# Patient Record
Sex: Female | Born: 1985 | Race: Black or African American | Hispanic: No | Marital: Single | Smoking: Never smoker
Health system: Southern US, Community
[De-identification: ages and names within clinical notes are randomized; demographics above are authoritative.]

## PROBLEM LIST (undated history)

## (undated) DIAGNOSIS — E039 Hypothyroidism, unspecified: Secondary | ICD-10-CM

## (undated) DIAGNOSIS — E669 Obesity, unspecified: Secondary | ICD-10-CM

## (undated) HISTORY — PX: CHOLECYSTECTOMY: SHX55

## (undated) HISTORY — PX: BREAST LUMPECTOMY: SHX2

## (undated) HISTORY — PX: TONSILLECTOMY: SUR1361

---

## 2010-07-24 ENCOUNTER — Emergency Department (HOSPITAL_COMMUNITY): Payer: Self-pay

## 2010-07-24 ENCOUNTER — Emergency Department (HOSPITAL_COMMUNITY)
Admission: EM | Admit: 2010-07-24 | Discharge: 2010-07-25 | Disposition: A | Discharge status: Home / Self Care | Payer: Self-pay | Attending: Emergency Medicine | Admitting: Emergency Medicine

## 2010-07-24 DIAGNOSIS — R110 Nausea: Secondary | ICD-10-CM | POA: Insufficient documentation

## 2010-07-24 DIAGNOSIS — R1031 Right lower quadrant pain: Secondary | ICD-10-CM | POA: Insufficient documentation

## 2010-07-24 DIAGNOSIS — D72829 Elevated white blood cell count, unspecified: Secondary | ICD-10-CM | POA: Insufficient documentation

## 2010-07-24 LAB — DIFFERENTIAL
Basophils Relative: 0 % (ref 0–1)
Eosinophils Absolute: 0.1 10*3/uL (ref 0.0–0.7)
Eosinophils Relative: 1 % (ref 0–5)
Monocytes Absolute: 0.8 10*3/uL (ref 0.1–1.0)
Monocytes Relative: 7 % (ref 3–12)
Neutrophils Relative %: 69 % (ref 43–77)

## 2010-07-24 LAB — CBC
MCH: 26.7 pg (ref 26.0–34.0)
MCHC: 32.7 g/dL (ref 30.0–36.0)
Platelets: 399 10*3/uL (ref 150–400)
RDW: 15.3 % (ref 11.5–15.5)

## 2010-07-24 LAB — URINALYSIS, ROUTINE W REFLEX MICROSCOPIC
Ketones, ur: 15 mg/dL — AB
Nitrite: NEGATIVE
Protein, ur: 30 mg/dL — AB

## 2010-07-24 LAB — WET PREP, GENITAL: Trich, Wet Prep: NONE SEEN

## 2010-07-24 LAB — COMPREHENSIVE METABOLIC PANEL
AST: 24 U/L (ref 0–37)
Albumin: 4 g/dL (ref 3.5–5.2)
Chloride: 104 mEq/L (ref 96–112)
Creatinine, Ser: 1.08 mg/dL (ref 0.50–1.10)
Potassium: 3.4 mEq/L — ABNORMAL LOW (ref 3.5–5.1)
Total Bilirubin: 0.2 mg/dL — ABNORMAL LOW (ref 0.3–1.2)
Total Protein: 8.1 g/dL (ref 6.0–8.3)

## 2010-07-24 LAB — URINE MICROSCOPIC-ADD ON

## 2010-07-25 LAB — GC/CHLAMYDIA PROBE AMP, GENITAL: GC Probe Amp, Genital: NEGATIVE

## 2010-07-25 MED ORDER — IOHEXOL 300 MG/ML  SOLN
125.0000 mL | Freq: Once | INTRAMUSCULAR | Status: AC | PRN
  Administered 2010-07-25: 125 mL via INTRAVENOUS

## 2012-03-21 ENCOUNTER — Emergency Department (HOSPITAL_BASED_OUTPATIENT_CLINIC_OR_DEPARTMENT_OTHER)
Admission: EM | Admit: 2012-03-21 | Discharge: 2012-03-21 | Disposition: A | Discharge status: Home / Self Care | Payer: Worker's Compensation | Attending: Emergency Medicine | Admitting: Emergency Medicine

## 2012-03-21 ENCOUNTER — Emergency Department (HOSPITAL_BASED_OUTPATIENT_CLINIC_OR_DEPARTMENT_OTHER): Payer: Worker's Compensation

## 2012-03-21 ENCOUNTER — Encounter (HOSPITAL_BASED_OUTPATIENT_CLINIC_OR_DEPARTMENT_OTHER): Payer: Self-pay

## 2012-03-21 DIAGNOSIS — R296 Repeated falls: Secondary | ICD-10-CM | POA: Insufficient documentation

## 2012-03-21 DIAGNOSIS — Y929 Unspecified place or not applicable: Secondary | ICD-10-CM | POA: Insufficient documentation

## 2012-03-21 DIAGNOSIS — S8392XA Sprain of unspecified site of left knee, initial encounter: Secondary | ICD-10-CM

## 2012-03-21 DIAGNOSIS — Y939 Activity, unspecified: Secondary | ICD-10-CM | POA: Insufficient documentation

## 2012-03-21 DIAGNOSIS — IMO0002 Reserved for concepts with insufficient information to code with codable children: Secondary | ICD-10-CM | POA: Insufficient documentation

## 2012-03-21 DIAGNOSIS — E669 Obesity, unspecified: Secondary | ICD-10-CM | POA: Insufficient documentation

## 2012-03-21 MED ORDER — HYDROCODONE-ACETAMINOPHEN 5-325 MG PO TABS: 1.0000 | ORAL_TABLET | Freq: Four times a day (QID) | ORAL | Status: DC | PRN

## 2012-03-21 NOTE — ED Notes (Signed)
Fell in BR stall at work approx 830pm-pain to left knee

## 2012-03-21 NOTE — ED Provider Notes (Signed)
History     CSN: [id number]  Arrival date & time 03/21/12  2141   First MD Initiated Contact with Patient 03/21/12 2221      Chief Complaint  Patient presents with  . Knee Injury    (Consider location/radiation/quality/duration/timing/severity/associated sxs/prior treatment) Patient is a 27 y.o. female presenting with knee pain. The history is provided by the patient.  Knee Pain Location:  Knee Time since incident:  2 hours Injury: yes   Mechanism of injury: fall   Fall:    Fall occurred:  In the bathroom   Impact surface:  Hard floor   Point of impact: left knee.   Entrapped after fall: no   Knee location:  L knee Pain details:    Quality:  Aching, shooting and throbbing   Radiates to:  Does not radiate   Severity:  Moderate   Onset quality:  Sudden   Timing:  Constant   Progression:  Unchanged Chronicity:  New Dislocation: no   Prior injury to area:  No Worsened by:  Extension Ineffective treatments:  None tried Associated symptoms: decreased ROM   Associated symptoms: no muscle weakness, no numbness and no swelling   Risk factors: obesity     History reviewed. No pertinent past medical history.  Past Surgical History  Procedure Laterality Date  . Cholecystectomy      No family history on file.  History  Substance Use Topics  . Smoking status: Never Smoker   . Smokeless tobacco: Not on file  . Alcohol Use: No    OB History   Grav Para Term Preterm Abortions TAB SAB Ect Mult Living                  Review of Systems  All other systems reviewed and are negative.    Allergies  Review of patient's allergies indicates no known allergies.  Home Medications   Current Outpatient Rx  Name  Route  Sig  Dispense  Refill  . HYDROcodone-acetaminophen (NORCO/VICODIN) 5-325 MG per tablet   Oral   Take 1 tablet by mouth every 6 (six) hours as needed for pain.   15 tablet   0     BP 138/79  Pulse 110  Temp(Src) 98.3 F (36.8 C) (Oral)  Resp  16  Ht 5\' 7"  (1.702 m)  Wt 280 lb (127.007 kg)  BMI 43.84 kg/m2  SpO2 100%  LMP 11/09/2011  Physical Exam  Nursing note and vitals reviewed. Constitutional: She is oriented to person, place, and time. She appears well-developed and well-nourished. No distress.  HENT:  Head: Normocephalic and atraumatic.  Pulmonary/Chest: Effort normal.  Musculoskeletal:       Left knee: She exhibits decreased range of motion and bony tenderness. She exhibits no swelling, no effusion and no ecchymosis. Tenderness found. LCL and patellar tendon tenderness noted. No medial joint line and no lateral joint line tenderness noted.  No posterior knee tenderness  Neurological: She is alert and oriented to person, place, and time. She has normal strength. No sensory deficit.  Skin: Skin is warm and dry. No rash noted. No erythema.    ED Course  Procedures (including critical care time)  Labs Reviewed - No data to display Dg Knee Complete 4 Views Left  03/21/2012  *RADIOLOGY REPORT*  Clinical Data: Patient fell in bathroom is seen.  LEFT KNEE - COMPLETE 4+ VIEW  Comparison: None.  Findings: No joint effusion.  No fractures.  No bony lesions.  The joint spaces are normally  maintained.  IMPRESSION: No evidence of acute skeletal trauma.   Original Report Authenticated By: Sander Radon, M.D.      1. Left knee sprain       MDM   Patient with a mechanical fall tonight landing on her left knee today while at work. She has pain over the patella tendon and lateral collateral ligament but no history consistent with dislocation.  Plain films negative. Ace wrap applied and patient provided crutches and pain control.        Gwyneth Sprout, MD 03/21/12 2326

## 2012-04-07 ENCOUNTER — Ambulatory Visit (INDEPENDENT_AMBULATORY_CARE_PROVIDER_SITE_OTHER): Payer: Worker's Compensation | Admitting: Family Medicine

## 2012-04-07 ENCOUNTER — Encounter: Payer: Self-pay | Admitting: Family Medicine

## 2012-04-07 VITALS — BP 154/100 | HR 100 | Ht 67.0 in | Wt 285.0 lb

## 2012-04-07 DIAGNOSIS — S8990XA Unspecified injury of unspecified lower leg, initial encounter: Secondary | ICD-10-CM | POA: Diagnosis not present

## 2012-04-07 MED ORDER — TRAMADOL HCL 50 MG PO TABS: 50.0000 mg | ORAL_TABLET | Freq: Three times a day (TID) | ORAL | Status: DC | PRN

## 2012-04-07 NOTE — Patient Instructions (Addendum)
I'm concerned that in addition to severe knee contusions you also have a medial meniscal tear. Ice knee 15 minutes at a time 3-4 times a day. Tramadol as needed for severe pain (no driving on this medication). Tylenol 1-2 tabs three times a day as needed. Ibuprofen 3 tabs three times a day as needed for pain and inflammation (with food). We will move forward with an MRI to assess for a meniscal tear - we will have to run this through worker's comp. I will call you the business day following the MRI - if you havent heard from me a couple days after this, call me.

## 2012-04-10 ENCOUNTER — Encounter: Payer: Self-pay | Admitting: Family Medicine

## 2012-04-10 DIAGNOSIS — S8992XA Unspecified injury of left lower leg, initial encounter: Secondary | ICD-10-CM | POA: Insufficient documentation

## 2012-04-10 NOTE — Assessment & Plan Note (Signed)
mechanism of injury consistent with severe knee contusion.  Also has some medial joint line pain and pain with meniscal testing concerning for possible medial meniscal tear.  Advised we move forward with MRI to further assess.  In meantime tylenol, tramadol, ibuprofen.  Will call with results and how to proceed.

## 2012-04-10 NOTE — Progress Notes (Signed)
  Subjective:    Patient ID: Bianca Dawson, female    DOB: 1985/06/05, 27 y.o.   MRN: [id number]  PCP: None listed Referral: worker's comp  HPI 27 yo F here for left knee injury.  Patient reports on 2/11 she went to the bathroom at work. On way in there was some water on the floor she didn't see - slipped on this and fell directly onto her left knee. + swelling but no bruising. Has been painful to walk on since that time. Pain medicine from ED makes her groggy so only taking half a pill. No prior knee injuries. No catching, locking, giving out.  History reviewed. No pertinent past medical history.  No current outpatient prescriptions on file prior to visit.   No current facility-administered medications on file prior to visit.    Past Surgical History  Procedure Laterality Date  . Cholecystectomy      No Known Allergies  History   Social History  . Marital Status: Single    Spouse Name: N/A    Number of Children: N/A  . Years of Education: N/A   Occupational History  . Not on file.   Social History Main Topics  . Smoking status: Never Smoker   . Smokeless tobacco: Not on file  . Alcohol Use: No  . Drug Use: No  . Sexually Active: Not on file   Other Topics Concern  . Not on file   Social History Narrative  . No narrative on file    Family History  Problem Relation Age of Onset  . Diabetes Father   . Heart attack Father   . Hyperlipidemia Father   . Hypertension Father   . Sudden death Neg Hx     BP 154/100  Pulse 100  Ht 5\' 7"  (1.702 m)  Wt 285 lb (129.275 kg)  BMI 44.63 kg/m2  LMP 11/09/2011  Review of Systems See HPI above.    Objective:   Physical Exam Gen: NAD  L knee: No gross deformity, ecchymoses, effusion.  TTP medial joint line but also over patella anteriorly, tibial tubercle.  No lateral joint line TTP.  No other TTP knee. FROM with pain on flexion. Negative ant/post drawers. Negative valgus/varus testing. Negative  lachmanns. Medial pain with mcmurrays and apleys.  Negative patellar apprehension. NV intact distally.    Assessment & Plan:  1. Left knee injury - mechanism of injury consistent with severe knee contusion.  Also has some medial joint line pain and pain with meniscal testing concerning for possible medial meniscal tear.  Advised we move forward with MRI to further assess.  In meantime tylenol, tramadol, ibuprofen.  Will call with results and how to proceed.

## 2012-04-11 ENCOUNTER — Encounter: Payer: Self-pay | Admitting: *Deleted

## 2012-04-11 ENCOUNTER — Encounter: Payer: Self-pay | Admitting: Family Medicine

## 2012-07-14 ENCOUNTER — Ambulatory Visit (INDEPENDENT_AMBULATORY_CARE_PROVIDER_SITE_OTHER): Payer: Worker's Compensation | Admitting: Family Medicine

## 2012-07-14 ENCOUNTER — Encounter: Payer: Self-pay | Admitting: Family Medicine

## 2012-07-14 VITALS — BP 125/73 | HR 96 | Ht 67.0 in | Wt 285.0 lb

## 2012-07-14 DIAGNOSIS — S8990XA Unspecified injury of unspecified lower leg, initial encounter: Secondary | ICD-10-CM

## 2012-07-14 DIAGNOSIS — S99919A Unspecified injury of unspecified ankle, initial encounter: Secondary | ICD-10-CM | POA: Diagnosis not present

## 2012-07-14 DIAGNOSIS — S8992XA Unspecified injury of left lower leg, initial encounter: Secondary | ICD-10-CM

## 2012-07-14 DIAGNOSIS — S99929A Unspecified injury of unspecified foot, initial encounter: Secondary | ICD-10-CM

## 2012-07-14 MED ORDER — TRAMADOL HCL 50 MG PO TABS: 50.0000 mg | ORAL_TABLET | Freq: Three times a day (TID) | ORAL | Status: DC | PRN

## 2012-07-14 NOTE — Patient Instructions (Signed)
We need to get the MRI as you likely have a meniscus tear that will need surgery. Continue ibuprofen 800mg  three times a day with food. Ok to take tylenol 500mg  1-2 tabs three times a day for pain. Continue icing 15 minutes at a time as needed. Tramadol as needed for severe pain - take 3 times a day as needed - no driving on this. Keep working on the approval because we need that to proceed.

## 2012-07-14 NOTE — Progress Notes (Addendum)
Subjective:    Patient ID: Bianca Dawson, female    DOB: 1985/08/08, 27 y.o.   MRN: [id number]  PCP: None listed Referral: worker's comp  HPI  27 yo F here for f/u left knee injury.  2/28: Patient reports on 2/11 she went to the bathroom at work. On way in there was some water on the floor she didn't see - slipped on this and fell directly onto her left knee. + swelling but no bruising. Has been painful to walk on since that time. Pain medicine from ED makes her groggy so only taking half a pill. No prior knee injuries. No catching, locking, giving out.  6/6: Patient reports she continues to struggle with left knee pain. Feels it laterally and medially. Has been icing, taking tramadol and ibuprofen. We ordered an MRI but per report it was denied by her worker's comp. She's had catching of knee when walking - will buckle at times and give out. Uses a wheelchair when she goes to grocery store because walking a lot is painful. + swelling.  History reviewed. No pertinent past medical history.  No current outpatient prescriptions on file prior to visit.   No current facility-administered medications on file prior to visit.    Past Surgical History  Procedure Laterality Date  . Cholecystectomy      No Known Allergies  History   Social History  . Marital Status: Single    Spouse Name: N/A    Number of Children: N/A  . Years of Education: N/A   Occupational History  . Not on file.   Social History Main Topics  . Smoking status: Never Smoker   . Smokeless tobacco: Not on file  . Alcohol Use: No  . Drug Use: No  . Sexually Active: Not on file   Other Topics Concern  . Not on file   Social History Narrative  . No narrative on file    Family History  Problem Relation Age of Onset  . Diabetes Father   . Heart attack Father   . Hyperlipidemia Father   . Hypertension Father   . Sudden death Neg Hx     BP 125/73  Pulse 96  Ht 5\' 7"  (1.702 m)  Wt 285  lb (129.275 kg)  BMI 44.63 kg/m2  Review of Systems  See HPI above.    Objective:   Physical Exam  Gen: NAD  L knee: No gross deformity, ecchymoses, effusion.  TTP medial and lateral joint lines.  No post patellar facet, other TTP. FROM with pain on flexion. Negative ant/post drawers. Negative valgus/varus testing. Negative lachmanns. Medial pain with mcmurrays and apleys.  Negative patellar apprehension. NV intact distally.    Assessment & Plan:  1. Left knee injury - Persistent pain almost 4 months out from her initial injury at work.  Now having mechanical symptoms (catching especially) in addition to persistent pain.  This and exam consistent with meniscal tear.  She is going to get an MRI and we'll go over results and next steps - if tear confirmed, will likely need arthroscopy as she has not improved to this point.  Tylenol, tramadol, ibuprofen for pain.  Addendum:  Had discussed MRI results with patient - no evidence of meniscal or ligamentous injuries.  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  Likely flared arthritis and had contusions that have resolved with her initial injury.  Offered a trial of cortisone injection given persistent pain - she has yet  to call us back regarding if she would like to proceed with this or not.

## 2012-07-14 NOTE — Assessment & Plan Note (Signed)
Persistent pain almost 4 months out from her initial injury at work.  Now having mechanical symptoms (catching especially) in addition to persistent pain.  This and exam consistent with meniscal tear.  She is going to get an MRI and we'll go over results and next steps - if tear confirmed, will likely need arthroscopy as she has not improved to this point.  Tylenol, tramadol, ibuprofen for pain.

## 2012-07-15 ENCOUNTER — Ambulatory Visit (HOSPITAL_BASED_OUTPATIENT_CLINIC_OR_DEPARTMENT_OTHER)
Admission: RE | Admit: 2012-07-15 | Discharge: 2012-07-15 | Disposition: A | Discharge status: Home / Self Care | Payer: Self-pay | Source: Ambulatory Visit | Attending: Family Medicine | Admitting: Family Medicine

## 2012-07-15 DIAGNOSIS — M1710 Unilateral primary osteoarthritis, unspecified knee: Secondary | ICD-10-CM | POA: Insufficient documentation

## 2012-07-15 DIAGNOSIS — M25469 Effusion, unspecified knee: Secondary | ICD-10-CM | POA: Insufficient documentation

## 2012-07-15 DIAGNOSIS — E669 Obesity, unspecified: Secondary | ICD-10-CM | POA: Insufficient documentation

## 2012-07-15 DIAGNOSIS — S8992XA Unspecified injury of left lower leg, initial encounter: Secondary | ICD-10-CM

## 2012-07-15 DIAGNOSIS — M2240 Chondromalacia patellae, unspecified knee: Secondary | ICD-10-CM | POA: Insufficient documentation

## 2012-10-29 ENCOUNTER — Encounter (HOSPITAL_COMMUNITY): Payer: Self-pay | Admitting: Emergency Medicine

## 2012-10-29 ENCOUNTER — Emergency Department (HOSPITAL_COMMUNITY)
Admission: EM | Admit: 2012-10-29 | Discharge: 2012-10-29 | Disposition: A | Discharge status: Home / Self Care | Payer: Self-pay | Attending: Emergency Medicine | Admitting: Emergency Medicine

## 2012-10-29 DIAGNOSIS — L509 Urticaria, unspecified: Secondary | ICD-10-CM | POA: Insufficient documentation

## 2012-10-29 DIAGNOSIS — R21 Rash and other nonspecific skin eruption: Secondary | ICD-10-CM | POA: Insufficient documentation

## 2012-10-29 MED ORDER — LORATADINE 10 MG PO TABS: 10.0000 mg | ORAL_TABLET | Freq: Every day | ORAL | Status: DC

## 2012-10-29 MED ORDER — PREDNISONE 20 MG PO TABS
60.0000 mg | ORAL_TABLET | Freq: Once | ORAL | Status: AC
  Administered 2012-10-29: 60 mg via ORAL
  Filled 2012-10-29: qty 3

## 2012-10-29 MED ORDER — FAMOTIDINE 20 MG PO TABS
20.0000 mg | ORAL_TABLET | Freq: Once | ORAL | Status: AC
  Administered 2012-10-29: 20 mg via ORAL
  Filled 2012-10-29: qty 1

## 2012-10-29 MED ORDER — DIPHENHYDRAMINE HCL 50 MG/ML IJ SOLN
25.0000 mg | Freq: Once | INTRAMUSCULAR | Status: AC
  Administered 2012-10-29: 25 mg via INTRAMUSCULAR
  Filled 2012-10-29: qty 1

## 2012-10-29 MED ORDER — PREDNISONE 10 MG PO TABS: 10.0000 mg | ORAL_TABLET | Freq: Every day | ORAL | Status: DC

## 2012-10-29 NOTE — ED Provider Notes (Signed)
CSN: [id number]     Arrival date & time 10/29/12  1850 History  This chart was scribed for Bianca Pel, PA, working with Leonette Most B. Bernette Mayers, MD by Blanchard Kelch, ED Scribe. This patient was seen in room WTR9/WTR9 and the patient's care was started at 7:57 PM.    Chief Complaint  Patient presents with  . Pruritis    The history is provided by the patient. No language interpreter was used.    HPI Comments: Bianca Dawson is a 27 y.o. female who presents to the Emergency Department complaining of constant urticaria on full body, worst on arms that began tonight after eating eggplant parmesan. She reports eating the eggplant parmesan without these symptoms. She states that she had a similar episode of itching after eating strawberries. She denies difficulty breathing, tongue or facial swelling. She took Benadryl for her itching without relief. She denies medical history of hypertension or diabetes.   History reviewed. No pertinent past medical history. Past Surgical History  Procedure Laterality Date  . Cholecystectomy     Family History  Problem Relation Age of Onset  . Diabetes Father   . Heart attack Father   . Hyperlipidemia Father   . Hypertension Father   . Sudden death Neg Hx    History  Substance Use Topics  . Smoking status: Never Smoker   . Smokeless tobacco: Not on file  . Alcohol Use: No   OB History   Grav Para Term Preterm Abortions TAB SAB Ect Mult Living                 Review of Systems  All other systems reviewed and are negative.    Allergies  Review of patient's allergies indicates no known allergies.  Home Medications   Current Outpatient Rx  Name  Route  Sig  Dispense  Refill  . loratadine (CLARITIN) 10 MG tablet   Oral   Take 1 tablet (10 mg total) by mouth daily.   14 tablet   0   . predniSONE (DELTASONE) 10 MG tablet   Oral   Take 1 tablet (10 mg total) by mouth daily.   21 tablet   0     Prednisone dose pack directions:   6  tabs on day ...   . traMADol (ULTRAM) 50 MG tablet   Oral   Take 1 tablet (50 mg total) by mouth every 8 (eight) hours as needed for pain.   90 tablet   0    Triage Vitals: BP 142/90  Pulse 110  Temp(Src) 98 F (36.7 C) (Oral)  Resp 16  SpO2 100%  LMP 10/09/2012  Physical Exam  Nursing note and vitals reviewed. Constitutional: She is oriented to person, place, and time. She appears well-developed and well-nourished. No distress.  HENT:  Head: Normocephalic and atraumatic.  Eyes: EOM are normal.  Neck: Neck supple. No tracheal deviation present.  Cardiovascular: Normal rate and regular rhythm.   Pulmonary/Chest: Effort normal and breath sounds normal. No respiratory distress.  Airways clear.  Musculoskeletal: Normal range of motion.  Neurological: She is alert and oriented to person, place, and time.  Skin: Skin is warm and dry.  Bilateral urticaria on both arms.   Psychiatric: She has a normal mood and affect. Her behavior is normal.    ED Course  Procedures (including critical care time)  DIAGNOSTIC STUDIES: Oxygen Saturation is 100% on room air, normal by my interpretation.    COORDINATION OF CARE:  6:18 PM - Patient  verbalizes understanding and agrees with treatment plan.   Labs Review Labs Reviewed - No data to display Imaging Review No results found.  MDM   1. Urticaria    loratadine (CLARITIN) 10 MG tablet Take 1 tablet (10 mg total) by mouth daily. 14 tablet [employer] Irine Seal, PA-C predniSONE (DELTASONE) 10 MG tablet Take 1 tablet (10 mg total) by mouth daily. 21 tablet Dorthula Matas, PA-C   External rash without upper airway involvement. Will treat as allergic reaction and recommends he stay away from eggplant.  27 y.o.[name]'s evaluation in the Emergency Department is complete. It has been determined that no acute conditions requiring further emergency intervention are present at this time. The patient/guardian have been advised of the  diagnosis and plan. We have discussed signs and symptoms that warrant return to the ED, such as changes or worsening in symptoms.  Vital signs are stable at discharge. Filed Vitals:   10/29/12 2032  BP: 144/85  Pulse: 94  Temp: 98.1 F (36.7 C)  Resp: 18    Patient/guardian has voiced understanding and agreed to follow-up with the PCP or specialist.  I personally performed the services described in this documentation, which was scribed in my presence. The recorded information has been reviewed and is accurate.   Dorthula Matas, PA-C 10/30/12 1818

## 2012-10-29 NOTE — ED Notes (Signed)
Pt arrived to ED with a complaint of pruritis.  Pt. sates she has not done anything out of the ordinary.  She did have eggplant parmesan but that it was not anything she has been allergic to in the past.  Pt has an intact airway.  Pt is not in any apparent distress at this time

## 2012-11-01 NOTE — ED Provider Notes (Signed)
Medical screening examination/treatment/procedure(s) were performed by non-physician practitioner and as supervising physician I was immediately available for consultation/collaboration.   Charles B. Bernette Mayers, MD 11/01/12 1450

## 2012-11-21 ENCOUNTER — Emergency Department (HOSPITAL_COMMUNITY)
Admission: EM | Admit: 2012-11-21 | Discharge: 2012-11-21 | Disposition: A | Discharge status: Home / Self Care | Payer: Self-pay | Attending: Emergency Medicine | Admitting: Emergency Medicine

## 2012-11-21 ENCOUNTER — Encounter (HOSPITAL_COMMUNITY): Payer: Self-pay | Admitting: Emergency Medicine

## 2012-11-21 ENCOUNTER — Emergency Department (HOSPITAL_COMMUNITY): Payer: Self-pay

## 2012-11-21 DIAGNOSIS — S20219A Contusion of unspecified front wall of thorax, initial encounter: Secondary | ICD-10-CM | POA: Insufficient documentation

## 2012-11-21 DIAGNOSIS — E669 Obesity, unspecified: Secondary | ICD-10-CM | POA: Insufficient documentation

## 2012-11-21 DIAGNOSIS — S20211A Contusion of right front wall of thorax, initial encounter: Secondary | ICD-10-CM

## 2012-11-21 HISTORY — DX: Obesity, unspecified: E66.9

## 2012-11-21 MED ORDER — HYDROCODONE-ACETAMINOPHEN 5-325 MG PO TABS: 1.0000 | ORAL_TABLET | Freq: Four times a day (QID) | ORAL | Status: DC | PRN

## 2012-11-21 MED ORDER — HYDROCODONE-ACETAMINOPHEN 5-325 MG PO TABS
1.0000 | ORAL_TABLET | Freq: Once | ORAL | Status: AC
  Administered 2012-11-21: 1 via ORAL
  Filled 2012-11-21: qty 1

## 2012-11-21 NOTE — ED Notes (Signed)
Pt discharged.Vital signs stable and GCS 15 
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
## 2012-11-22 NOTE — ED Provider Notes (Signed)
Medical screening examination/treatment/procedure(s) were performed by non-physician practitioner and as supervising physician I was immediately available for consultation/collaboration.   Enid Skeens, MD 11/22/12 [phone]

## 2012-12-21 ENCOUNTER — Emergency Department (HOSPITAL_COMMUNITY): Payer: Self-pay

## 2012-12-21 ENCOUNTER — Emergency Department (HOSPITAL_COMMUNITY)
Admission: EM | Admit: 2012-12-21 | Discharge: 2012-12-21 | Disposition: A | Discharge status: Home / Self Care | Payer: Self-pay | Attending: Emergency Medicine | Admitting: Emergency Medicine

## 2012-12-21 ENCOUNTER — Encounter (HOSPITAL_COMMUNITY): Payer: Self-pay | Admitting: Emergency Medicine

## 2012-12-21 DIAGNOSIS — Z3202 Encounter for pregnancy test, result negative: Secondary | ICD-10-CM | POA: Insufficient documentation

## 2012-12-21 DIAGNOSIS — Z9089 Acquired absence of other organs: Secondary | ICD-10-CM | POA: Insufficient documentation

## 2012-12-21 DIAGNOSIS — E669 Obesity, unspecified: Secondary | ICD-10-CM | POA: Insufficient documentation

## 2012-12-21 DIAGNOSIS — K2970 Gastritis, unspecified, without bleeding: Secondary | ICD-10-CM | POA: Insufficient documentation

## 2012-12-21 LAB — CBC WITH DIFFERENTIAL/PLATELET
Eosinophils Absolute: 0.2 10*3/uL (ref 0.0–0.7)
Hemoglobin: 10.2 g/dL — ABNORMAL LOW (ref 12.0–15.0)
Lymphocytes Relative: 25 % (ref 12–46)
Lymphs Abs: 2.9 10*3/uL (ref 0.7–4.0)
MCH: 24.9 pg — ABNORMAL LOW (ref 26.0–34.0)
Monocytes Relative: 8 % (ref 3–12)
Neutro Abs: 7.8 10*3/uL — ABNORMAL HIGH (ref 1.7–7.7)
Neutrophils Relative %: 66 % (ref 43–77)
Platelets: 380 10*3/uL (ref 150–400)
RBC: 4.1 MIL/uL (ref 3.87–5.11)
WBC: 11.8 10*3/uL — ABNORMAL HIGH (ref 4.0–10.5)

## 2012-12-21 LAB — COMPREHENSIVE METABOLIC PANEL
ALT: 15 U/L (ref 0–35)
Alkaline Phosphatase: 89 U/L (ref 39–117)
BUN: 12 mg/dL (ref 6–23)
CO2: 27 mEq/L (ref 19–32)
Chloride: 102 mEq/L (ref 96–112)
GFR calc Af Amer: >90 mL/min (ref >90)
GFR calc non Af Amer: 84 mL/min — ABNORMAL LOW (ref >90)
Glucose, Bld: 95 mg/dL (ref 70–99)
Potassium: 4 mEq/L (ref 3.5–5.1)
Sodium: 138 mEq/L (ref 135–145)
Total Bilirubin: 0.1 mg/dL — ABNORMAL LOW (ref 0.3–1.2)

## 2012-12-21 LAB — LIPASE, BLOOD: Lipase: 40 U/L (ref 11–59)

## 2012-12-21 LAB — URINALYSIS, ROUTINE W REFLEX MICROSCOPIC
Bilirubin Urine: NEGATIVE
Hgb urine dipstick: NEGATIVE
Protein, ur: NEGATIVE mg/dL
Urobilinogen, UA: 1 mg/dL (ref 0.0–1.0)

## 2012-12-21 MED ORDER — ONDANSETRON HCL 4 MG/2ML IJ SOLN
4.0000 mg | Freq: Once | INTRAMUSCULAR | Status: AC
  Administered 2012-12-21: 4 mg via INTRAVENOUS
  Filled 2012-12-21: qty 2

## 2012-12-21 MED ORDER — TRAMADOL HCL 50 MG PO TABS: 50.0000 mg | ORAL_TABLET | Freq: Four times a day (QID) | ORAL | Status: DC | PRN

## 2012-12-21 MED ORDER — HYDROMORPHONE HCL PF 1 MG/ML IJ SOLN
1.0000 mg | Freq: Once | INTRAMUSCULAR | Status: AC
  Administered 2012-12-21: 1 mg via INTRAVENOUS
  Filled 2012-12-21: qty 1

## 2012-12-21 MED ORDER — SODIUM CHLORIDE 0.9 % IV BOLUS (SEPSIS)
1000.0000 mL | Freq: Once | INTRAVENOUS | Status: AC
  Administered 2012-12-21: 1000 mL via INTRAVENOUS

## 2012-12-21 MED ORDER — PROMETHAZINE HCL 25 MG PO TABS: 25.0000 mg | ORAL_TABLET | Freq: Four times a day (QID) | ORAL | Status: DC | PRN

## 2012-12-21 NOTE — ED Provider Notes (Signed)
CSN: [id number]     Arrival date & time 12/21/12  1647 History   First MD Initiated Contact with Patient 12/21/12 1956     Chief Complaint  Patient presents with  . Abdominal Pain  . Emesis   (Consider location/radiation/quality/duration/timing/severity/associated sxs/prior Treatment) Patient is a 27 y.o. female presenting with abdominal pain and vomiting.  Abdominal Pain Associated symptoms: vomiting   Emesis Associated symptoms: abdominal pain    Pt with history of cholecystectomy reports 2 days of moderate to severe L sided abdominal/flank pain, associated with multiple episodes of vomiting and headache but no fever, diarrhea, constipation or dysuria. No blood in emesis or urine.    Past Medical History  Diagnosis Date  . Obesity    Past Surgical History  Procedure Laterality Date  . Cholecystectomy     Family History  Problem Relation Age of Onset  . Diabetes Father   . Heart attack Father   . Hyperlipidemia Father   . Hypertension Father   . Sudden death Neg Hx    History  Substance Use Topics  . Smoking status: Never Smoker   . Smokeless tobacco: Not on file  . Alcohol Use: No   OB History   Grav Para Term Preterm Abortions TAB SAB Ect Mult Living                 Review of Systems  Gastrointestinal: Positive for vomiting and abdominal pain.   All other systems reviewed and are negative except as noted in HPI.   Allergies  Bee venom; Oxycontin; and Strawberry  Home Medications   Current Outpatient Rx  Name  Route  Sig  Dispense  Refill  . HYDROcodone-acetaminophen (NORCO/VICODIN) 5-325 MG per tablet   Oral   Take 1 tablet by mouth every 6 (six) hours as needed for pain.   12 tablet   0    BP 140/83  Pulse 105  Temp(Src) 97.9 F (36.6 C) (Oral)  Resp 18  Ht 5\' 7"  (1.702 m)  Wt 298 lb 9.6 oz (135.444 kg)  BMI 46.76 kg/m2  SpO2 100%  LMP 12/09/2012 Physical Exam  Nursing note and vitals reviewed. Constitutional: She is oriented to person,  place, and time. She appears well-developed and well-nourished.  HENT:  Head: Normocephalic and atraumatic.  Eyes: EOM are normal. Pupils are equal, round, and reactive to light.  Neck: Normal range of motion. Neck supple.  Cardiovascular: Normal rate, normal heart sounds and intact distal pulses.   Pulmonary/Chest: Effort normal and breath sounds normal. She has no wheezes. She has no rales.  Abdominal: Bowel sounds are normal. She exhibits no distension and no mass. There is tenderness (LUQ, no peritoneal signs). There is no rebound and no guarding.  Musculoskeletal: Normal range of motion. She exhibits no edema and no tenderness.  Neurological: She is alert and oriented to person, place, and time. She has normal strength. No cranial nerve deficit or sensory deficit.  Skin: Skin is warm and dry. No rash noted.  Psychiatric: She has a normal mood and affect.    ED Course  Procedures (including critical care time) Labs Review Labs Reviewed  CBC WITH DIFFERENTIAL - Abnormal; Notable for the following:    WBC 11.8 (*)    Hemoglobin 10.2 (*)    HCT 32.4 (*)    MCH 24.9 (*)    RDW 17.5 (*)    Neutro Abs 7.8 (*)    All other components within normal limits  COMPREHENSIVE METABOLIC PANEL - Abnormal;  Notable for the following:    Total Bilirubin 0.1 (*)    GFR calc non Af Amer 84 (*)    All other components within normal limits  URINALYSIS, ROUTINE W REFLEX MICROSCOPIC - Abnormal; Notable for the following:    APPearance CLOUDY (*)    Ketones, ur 15 (*)    Leukocytes, UA SMALL (*)    All other components within normal limits  URINE MICROSCOPIC-ADD ON - Abnormal; Notable for the following:    Squamous Epithelial / LPF FEW (*)    Bacteria, UA MANY (*)    All other components within normal limits  LIPASE, BLOOD  POCT PREGNANCY, URINE   Imaging Review Dg Abd Acute W/chest  12/21/2012   CLINICAL DATA:  Abdominal pain, vomiting and back pain.  EXAM: ACUTE ABDOMEN SERIES (ABDOMEN 2  VIEW & CHEST 1 VIEW)  COMPARISON:  11/21/2012  FINDINGS: There is no evidence of dilated bowel loops or free intraperitoneal air. No radiopaque calculi. Clips in the right upper quadrant from a prior cholecystectomy. The soft tissues are otherwise unremarkable.  Heart size and mediastinal contours are within normal limits. Both lungs are clear.  No bony abnormality.  IMPRESSION: Negative abdominal radiographs.  No acute cardiopulmonary disease.   Electronically Signed   By: Amie Portland M.D.   On: 12/21/2012 21:22    EKG Interpretation   None       MDM   1. Gastritis     Pt feeling better after meds, labs unremarkable. No concern for SBO with neg xray. Gastritis vs viral infection. Pain and nausea meds, PCP followup.     Charles B. Bernette Mayers, MD 12/21/12 2255

## 2012-12-21 NOTE — ED Notes (Signed)
Pt reports left side abd pain and n/v x 2 days. Denies diarrhea, vaginal or urinary symptoms.

## 2013-01-05 ENCOUNTER — Emergency Department (HOSPITAL_COMMUNITY)
Admission: EM | Admit: 2013-01-05 | Discharge: 2013-01-05 | Disposition: A | Discharge status: Home / Self Care | Payer: Self-pay | Attending: Emergency Medicine | Admitting: Emergency Medicine

## 2013-01-05 ENCOUNTER — Encounter (HOSPITAL_COMMUNITY): Payer: Self-pay | Admitting: Emergency Medicine

## 2013-01-05 ENCOUNTER — Emergency Department (HOSPITAL_COMMUNITY): Payer: Self-pay

## 2013-01-05 DIAGNOSIS — M542 Cervicalgia: Secondary | ICD-10-CM | POA: Insufficient documentation

## 2013-01-05 DIAGNOSIS — Z91040 Latex allergy status: Secondary | ICD-10-CM | POA: Insufficient documentation

## 2013-01-05 DIAGNOSIS — R109 Unspecified abdominal pain: Secondary | ICD-10-CM | POA: Insufficient documentation

## 2013-01-05 DIAGNOSIS — R079 Chest pain, unspecified: Secondary | ICD-10-CM | POA: Insufficient documentation

## 2013-01-05 DIAGNOSIS — R112 Nausea with vomiting, unspecified: Secondary | ICD-10-CM | POA: Insufficient documentation

## 2013-01-05 LAB — COMPREHENSIVE METABOLIC PANEL
AST: 14 U/L (ref 0–37)
BUN: 8 mg/dL (ref 6–23)
CO2: 26 mEq/L (ref 19–32)
Calcium: 9.5 mg/dL (ref 8.4–10.5)
Chloride: 100 mEq/L (ref 96–112)
Creatinine, Ser: 0.94 mg/dL (ref 0.50–1.10)
GFR calc Af Amer: >90 mL/min (ref >90)
GFR calc non Af Amer: 83 mL/min — ABNORMAL LOW (ref >90)
Glucose, Bld: 87 mg/dL (ref 70–99)
Total Bilirubin: <0.1 mg/dL — ABNORMAL LOW (ref 0.3–1.2)

## 2013-01-05 LAB — CBC
HCT: 32 % — ABNORMAL LOW (ref 36.0–46.0)
Hemoglobin: 10.1 g/dL — ABNORMAL LOW (ref 12.0–15.0)
MCH: 24.7 pg — ABNORMAL LOW (ref 26.0–34.0)
MCHC: 31.6 g/dL (ref 30.0–36.0)
MCV: 78.2 fL (ref 78.0–100.0)
RBC: 4.09 MIL/uL (ref 3.87–5.11)
WBC: 12.1 10*3/uL — ABNORMAL HIGH (ref 4.0–10.5)

## 2013-01-05 LAB — POCT I-STAT TROPONIN I: Troponin i, poc: 0.03 ng/mL (ref 0.00–0.08)

## 2013-01-05 LAB — D-DIMER, QUANTITATIVE: D-Dimer, Quant: <0.27 ug{FEU}/mL (ref 0.00–0.48)

## 2013-01-05 MED ORDER — IBUPROFEN 600 MG PO TABS: 600.0000 mg | ORAL_TABLET | Freq: Once | ORAL | Status: DC

## 2013-01-05 MED ORDER — ONDANSETRON 4 MG PO TBDP
4.0000 mg | ORAL_TABLET | Freq: Once | ORAL | Status: AC
  Administered 2013-01-05: 4 mg via ORAL
  Filled 2013-01-05: qty 1

## 2013-01-05 MED ORDER — SODIUM CHLORIDE 0.9 % IV BOLUS (SEPSIS)
1000.0000 mL | Freq: Once | INTRAVENOUS | Status: AC
  Administered 2013-01-05: 1000 mL via INTRAVENOUS

## 2013-01-05 MED ORDER — IBUPROFEN 400 MG PO TABS
600.0000 mg | ORAL_TABLET | Freq: Once | ORAL | Status: AC
  Administered 2013-01-05: 600 mg via ORAL
  Filled 2013-01-05 (×2): qty 1

## 2013-01-05 MED ORDER — ORPHENADRINE CITRATE ER 100 MG PO TB12: 100.0000 mg | ORAL_TABLET | Freq: Two times a day (BID) | ORAL | Status: DC | PRN

## 2013-01-05 MED ORDER — DIAZEPAM 5 MG PO TABS
5.0000 mg | ORAL_TABLET | Freq: Once | ORAL | Status: AC
  Administered 2013-01-05: 5 mg via ORAL
  Filled 2013-01-05: qty 1

## 2013-01-05 NOTE — ED Notes (Signed)
Pt c/o right sided CP into abd x 2 days with some N/V

## 2013-01-05 NOTE — ED Provider Notes (Signed)
CSN: [id number]     Arrival date & time 01/05/13  1617 History   First MD Initiated Contact with Patient 01/05/13 1636     Chief Complaint  Patient presents with  . Chest Pain  . Abdominal Pain   (Consider location/radiation/quality/duration/timing/severity/associated sxs/prior Treatment) HPI  Chief complaint neck pain history provided by patient  Patient is a 27 year old female no significant past medical history comes today with chief complaint right-sided neck pain. Patient states she's had an approximately 1-2 days worth of right-sided neck pain. This is on the lateral side of her neck. She describes a sharp stabbing pain which is waxing and waning. States this began at her neck and radiates to her right side of her chest and into her abdomen. She is taking Tylenol one time with no relief of symptoms. Describes as moderate in severity. Patient denies any, or injuries to neck. Patient also reports 2 episodes of nausea vomiting over last 2 days. Unsure this is associated with pain. It is nonbilious nonbloody.  Past Medical History  Diagnosis Date  . Obesity    Past Surgical History  Procedure Laterality Date  . Cholecystectomy     Family History  Problem Relation Age of Onset  . Diabetes Father   . Heart attack Father   . Hyperlipidemia Father   . Hypertension Father   . Sudden death Neg Hx    History  Substance Use Topics  . Smoking status: Never Smoker   . Smokeless tobacco: Not on file  . Alcohol Use: No   OB History   Grav Para Term Preterm Abortions TAB SAB Ect Mult Living                 Review of Systems  Constitutional: Negative for fatigue.  HENT: Negative for facial swelling, mouth sores and trouble swallowing.   Respiratory: Negative for cough and shortness of breath.   Cardiovascular: Negative for chest pain.  Gastrointestinal: Positive for abdominal pain (radiating from neck).  Genitourinary: Negative for dysuria.  Musculoskeletal: Negative for back  pain.  Skin: Negative for rash.  Neurological: Negative for headaches.  Psychiatric/Behavioral: Negative for agitation.  All other systems reviewed and are negative.    Allergies  Bee venom; Strawberry; Oxycontin; and Latex  Home Medications  No current outpatient prescriptions on file. BP 148/97  Pulse 105  Temp(Src) 97.6 F (36.4 C) (Oral)  Resp 16  SpO2 100%  LMP 12/09/2012 Physical Exam  Nursing note and vitals reviewed. Constitutional: She is oriented to person, place, and time. She appears well-developed and well-nourished.  HENT:  Head: Normocephalic and atraumatic.  Eyes: EOM are normal. Pupils are equal, round, and reactive to light.  Neck: Normal range of motion.  Patient with tenderness palpation of right SCM. When compared to left patient's right SCM appears to be slightly in spasm   Cardiovascular: Normal rate, regular rhythm and intact distal pulses.   Triage note and tachycardia. Not tachycardic at my exam  Pulmonary/Chest: Effort normal and breath sounds normal. No respiratory distress. She exhibits no tenderness.  Abdominal: Soft. She exhibits no distension. There is no tenderness. There is no rebound and no guarding.  No focal tenderness palpation. No peritonitis, rebound, guarding  Musculoskeletal: Normal range of motion. She exhibits no edema and no tenderness.  No musculoskeletal tenderness to palpation outside of right sternocleidomastoid  Neurological: She is alert and oriented to person, place, and time. No cranial nerve deficit. She exhibits normal muscle tone. Coordination normal.  Skin: Skin is  warm and dry.  Psychiatric: She has a normal mood and affect.    ED Course  Procedures (including critical care time) Labs Review Labs Reviewed  CBC - Abnormal; Notable for the following:    WBC 12.1 (*)    Hemoglobin 10.1 (*)    HCT 32.0 (*)    MCH 24.7 (*)    RDW 17.0 (*)    Platelets 426 (*)    All other components within normal limits   COMPREHENSIVE METABOLIC PANEL - Abnormal; Notable for the following:    GFR calc non Af Amer 83 (*)    All other components within normal limits  LIPASE, BLOOD  POCT I-STAT TROPONIN I   Imaging Review Dg Chest 2 View  01/05/2013   CLINICAL DATA:  Pain extending from the neck to the abdomen.  EXAM: CHEST  2 VIEW  COMPARISON:  Chest x-ray 12/21/2012.  FINDINGS: Lung volumes are normal. No consolidative airspace disease. No pleural effusions. No pneumothorax. No pulmonary nodule or mass noted. Pulmonary vasculature and the cardiomediastinal silhouette are within normal limits.  IMPRESSION: 1.  No radiographic evidence of acute cardiopulmonary disease.   Electronically Signed   By: Trudie Reed M.D.   On: 01/05/2013 16:54    EKG Interpretation    Date/Time:  Friday January 05 2013 16:25:03 EST Ventricular Rate:  103 PR Interval:  146 QRS Duration: 80 QT Interval:  348 QTC Calculation: 455 R Axis:   70 Text Interpretation:  Sinus tachycardia Nonspecific T wave abnormality Abnormal ECG Confirmed by ZAVITZ  MD, JOSHUA ([id number]) on 01/05/2013 4:37:45 PM            MDM   1. Neck pain     Patient with signs and symptoms consistent with musculoskeletal back pain. Patient has tenderness to palpation of the right SCM. Also appears to have some SCM spasm. Given Valium 5 here. Recommend ibuprofen, ice and provide small prescription for Norflex at home. Patient states this pain from right side of neck radiates through right chest. EKG no concerning signs of overt ischemia. Troponin ordered as triage order within normal limits. CBC CMP unremarkable. Chest x-ray shows no concerning cardiopulmonary abnormalities. Patient with no risk factors for PE. Secondary tachycardia not able to [facility]. Dimer negative. No meningismus. No spinal tenderness. No stridor, CTAB. Oral exam shows no signs of peritonsillar abscess or retropharyngeal abscess. Patient also endorses 2 episodes of nausea and vomiting.  Unable to clarify this is associated with pain. However patient with benign abdominal exam with no peritonitis, guarding, rebound. Not acute abdomen. Patient tolerated by mouth. Appears nontoxic, in no apparent distress. Doubt any serious intra-abdominal pathology. We'll provide Zofran symptomatically for likely gastritis. Considered other diagnoses including pyelonephritis, gallbladder pathology or aortic pathology. However patient's clinical picture, exam and history not consistent with these. Patient given return precautions which include worsening pain, shortness of breath, worsening chest pain, worsening vomiting or other concerning symptoms. Voiced understanding was discharged no acute issues    Bridgett Larsson, MD 01/05/13 [phone]

## 2013-01-06 NOTE — ED Provider Notes (Signed)
Medical screening examination/treatment/procedure(s) were conducted as a shared visit with non-physician practitioner(s) or resident and myself. I personally evaluated the patient during the encounter and agree with the findings and plan unless otherwise indicated.  I have personally reviewed any xrays and/ or EKG's with the provider and I agree with interpretation.  Right neck pain with radiation to right anterior chest, pleuritic, pt does not have gb. No cardiac hx or risks except obesity.  No PE risks. Pt very low pretest prob for emergent etiology of pain. D dimer neg. EKG no acute findings, non specific findings, no old. Atypical story. Close fup outpt. Fluids given.   Right neck pain, Right chest pain   Enid Skeens, MD 01/06/13 0010

## 2013-02-27 ENCOUNTER — Ambulatory Visit: Payer: Self-pay | Admitting: Advanced Practice Midwife

## 2013-03-23 ENCOUNTER — Emergency Department (HOSPITAL_COMMUNITY): Payer: No Typology Code available for payment source

## 2013-03-23 ENCOUNTER — Encounter (HOSPITAL_COMMUNITY): Payer: Self-pay | Admitting: Emergency Medicine

## 2013-03-23 ENCOUNTER — Emergency Department (HOSPITAL_COMMUNITY)
Admission: EM | Admit: 2013-03-23 | Discharge: 2013-03-23 | Disposition: A | Discharge status: Home / Self Care | Payer: No Typology Code available for payment source | Attending: Emergency Medicine | Admitting: Emergency Medicine

## 2013-03-23 DIAGNOSIS — Z91040 Latex allergy status: Secondary | ICD-10-CM | POA: Insufficient documentation

## 2013-03-23 DIAGNOSIS — R002 Palpitations: Secondary | ICD-10-CM

## 2013-03-23 DIAGNOSIS — R0602 Shortness of breath: Secondary | ICD-10-CM

## 2013-03-23 DIAGNOSIS — E669 Obesity, unspecified: Secondary | ICD-10-CM | POA: Insufficient documentation

## 2013-03-23 DIAGNOSIS — R079 Chest pain, unspecified: Secondary | ICD-10-CM | POA: Insufficient documentation

## 2013-03-23 DIAGNOSIS — Z3202 Encounter for pregnancy test, result negative: Secondary | ICD-10-CM | POA: Insufficient documentation

## 2013-03-23 LAB — URINE MICROSCOPIC-ADD ON

## 2013-03-23 LAB — TSH: TSH: 2.825 u[IU]/mL (ref 0.350–4.500)

## 2013-03-23 LAB — CBC
HCT: 33.1 % — ABNORMAL LOW (ref 36.0–46.0)
Hemoglobin: 10.3 g/dL — ABNORMAL LOW (ref 12.0–15.0)
MCH: 24.5 pg — ABNORMAL LOW (ref 26.0–34.0)
MCHC: 31.1 g/dL (ref 30.0–36.0)
MCV: 78.8 fL (ref 78.0–100.0)
PLATELETS: 392 10*3/uL (ref 150–400)
RBC: 4.2 MIL/uL (ref 3.87–5.11)
RDW: 17.1 % — ABNORMAL HIGH (ref 11.5–15.5)
WBC: 9.9 10*3/uL (ref 4.0–10.5)

## 2013-03-23 LAB — URINALYSIS, ROUTINE W REFLEX MICROSCOPIC
Bilirubin Urine: NEGATIVE
Glucose, UA: NEGATIVE mg/dL
Ketones, ur: NEGATIVE mg/dL
LEUKOCYTES UA: NEGATIVE
Nitrite: NEGATIVE
Protein, ur: NEGATIVE mg/dL
Specific Gravity, Urine: 1.025 (ref 1.005–1.030)
UROBILINOGEN UA: 0.2 mg/dL (ref 0.0–1.0)
pH: 6.5 (ref 5.0–8.0)

## 2013-03-23 LAB — BASIC METABOLIC PANEL
BUN: 7 mg/dL (ref 6–23)
CHLORIDE: 101 meq/L (ref 96–112)
CO2: 24 meq/L (ref 19–32)
Calcium: 9.4 mg/dL (ref 8.4–10.5)
Creatinine, Ser: 0.92 mg/dL (ref 0.50–1.10)
GFR calc Af Amer: >90 mL/min (ref >90)
GFR calc non Af Amer: 85 mL/min — ABNORMAL LOW (ref >90)
Glucose, Bld: 88 mg/dL (ref 70–99)
Potassium: 3.8 mEq/L (ref 3.7–5.3)
SODIUM: 137 meq/L (ref 137–147)

## 2013-03-23 LAB — POCT I-STAT TROPONIN I: Troponin i, poc: 0 ng/mL (ref 0.00–0.08)

## 2013-03-23 LAB — POCT PREGNANCY, URINE: PREG TEST UR: NEGATIVE

## 2013-03-23 MED ORDER — TRAMADOL HCL 50 MG PO TABS: 50.0000 mg | ORAL_TABLET | Freq: Four times a day (QID) | ORAL | Status: DC | PRN

## 2013-03-23 NOTE — ED Provider Notes (Signed)
Medical screening examination/treatment/procedure(s) were performed by non-physician practitioner and as supervising physician I was immediately available for consultation/collaboration.  EKG Interpretation    Date/Time:  Friday March 23 2013 11:25:38 EST Ventricular Rate:  102 PR Interval:  160 QRS Duration: 82 QT Interval:  366 QTC Calculation: 477 R Axis:   60 Text Interpretation:  Sinus tachycardia Nonspecific T wave abnormality Abnormal ECG No significant change since last tracing Confirmed by Dina Rich  MD, COURTNEY ([id number]) on 03/23/2013 4:33:24 PM             Merryl Hacker, MD 03/23/13 2151

## 2013-03-23 NOTE — Discharge Instructions (Signed)
Palpitations   A palpitation is the feeling that your heartbeat is irregular or is faster than normal. It may feel like your heart is fluttering or skipping a beat. Palpitations are usually not a serious problem. However, in some cases, you may need further medical evaluation.  CAUSES   Palpitations can be caused by:   Smoking.   Caffeine or other stimulants, such as diet pills or energy drinks.   Alcohol.   Stress and anxiety.   Strenuous physical activity.   Fatigue.   Certain medicines.   Heart disease, especially if you have a history of arrhythmias. This includes atrial fibrillation, atrial flutter, or supraventricular tachycardia.   An improperly working pacemaker or defibrillator.  DIAGNOSIS   To find the cause of your palpitations, your caregiver will take your history and perform a physical exam. Tests may also be done, including:   Electrocardiography (ECG). This test records the heart's electrical activity.   Cardiac monitoring. This allows your caregiver to monitor your heart rate and rhythm in real time.   Holter monitor. This is a portable device that records your heartbeat and can help diagnose heart arrhythmias. It allows your caregiver to track your heart activity for several days, if needed.   Stress tests by exercise or by giving medicine that makes the heart beat faster.  TREATMENT   Treatment of palpitations depends on the cause of your symptoms and can vary greatly. Most cases of palpitations do not require any treatment other than time, relaxation, and monitoring your symptoms. Other causes, such as atrial fibrillation, atrial flutter, or supraventricular tachycardia, usually require further treatment.  HOME CARE INSTRUCTIONS    Avoid:   Caffeinated coffee, tea, soft drinks, diet pills, and energy drinks.   Chocolate.   Alcohol.   Stop smoking if you smoke.   Reduce your stress and anxiety. Things that can help you relax include:   A method that measures bodily functions so  you can learn to control them (biofeedback).   Yoga.   Meditation.   Physical activity such as swimming, jogging, or walking.   Get plenty of rest and sleep.  SEEK MEDICAL CARE IF:    You continue to have a fast or irregular heartbeat beyond 24 hours.   Your palpitations occur more often.  SEEK IMMEDIATE MEDICAL CARE IF:   You develop chest pain or shortness of breath.   You have a severe headache.   You feel dizzy, or you faint.  MAKE SURE YOU:   Understand these instructions.   Will watch your condition.   Will get help right away if you are not doing well or get worse.  Document Released: 01/23/2000 Document Revised: 05/22/2012 Document Reviewed: 03/26/2011  [employer] Patient Information 2014 [employer]

## 2013-03-23 NOTE — ED Notes (Signed)
Pt in stating over the last few days she has noted palpitations and shortness of breath, states she recently started a new exercise program and wasn't sure if that was causing this, but also has a history of thyroid problems but hasn't had to take medications in a few years, last check was almost two years ago, no distress at this time, c/o chest tightness at this time

## 2013-03-23 NOTE — ED Provider Notes (Signed)
CSN: [id number]     Arrival date & time 03/23/13  1122 History   First MD Initiated Contact with Patient 03/23/13 1559     Chief Complaint  Patient presents with  . Shortness of Breath  . Palpitations     (Consider location/radiation/quality/duration/timing/severity/associated sxs/prior Treatment) HPI Comments: Pt states that she has had intermittent palpitations and sob for the last 3 days:pt denies cp:pt states that she has not tried anything;nothing makes the pain better or worse. Pt states that she has a history of thyroid problems but doesn't know if hypo or hyper:pt has not taken any medication in 2 years  The history is provided by the patient. No language interpreter was used.    Past Medical History  Diagnosis Date  . Obesity    Past Surgical History  Procedure Laterality Date  . Cholecystectomy     Family History  Problem Relation Age of Onset  . Diabetes Father   . Heart attack Father   . Hyperlipidemia Father   . Hypertension Father   . Sudden death Neg Hx    History  Substance Use Topics  . Smoking status: Never Smoker   . Smokeless tobacco: Not on file  . Alcohol Use: No   OB History   Grav Para Term Preterm Abortions TAB SAB Ect Mult Living                 Review of Systems  Constitutional: Negative.   Respiratory: Positive for shortness of breath. Negative for cough.   Cardiovascular: Positive for chest pain.      Allergies  Bee venom; Strawberry; Oxycontin; and Latex  Home Medications  No current outpatient prescriptions on file. BP 144/105  Pulse 84  Temp(Src) 98.5 F (36.9 C) (Oral)  Resp 20  Wt 280 lb (127.007 kg)  SpO2 100%  LMP 03/11/2013 Physical Exam  Nursing note and vitals reviewed. Constitutional: She is oriented to person, place, and time. She appears well-developed and well-nourished.  HENT:  Head: Normocephalic and atraumatic.  Cardiovascular: Normal rate and regular rhythm.   Pulmonary/Chest: Effort normal and breath  sounds normal.  Abdominal: Soft. Bowel sounds are normal. There is no tenderness.  Musculoskeletal: Normal range of motion.  Neurological: She is alert and oriented to person, place, and time. Coordination normal.  Skin: Skin is warm and dry.    ED Course  Procedures (including critical care time) Labs Review Labs Reviewed  CBC - Abnormal; Notable for the following:    Hemoglobin 10.3 (*)    HCT 33.1 (*)    MCH 24.5 (*)    RDW 17.1 (*)    All other components within normal limits  BASIC METABOLIC PANEL - Abnormal; Notable for the following:    GFR calc non Af Amer 85 (*)    All other components within normal limits  URINALYSIS, ROUTINE W REFLEX MICROSCOPIC - Abnormal; Notable for the following:    APPearance HAZY (*)    Hgb urine dipstick MODERATE (*)    All other components within normal limits  URINE MICROSCOPIC-ADD ON - Abnormal; Notable for the following:    Squamous Epithelial / LPF MANY (*)    Bacteria, UA FEW (*)    All other components within normal limits  URINE CULTURE  TSH  POCT I-STAT TROPONIN I  POCT PREGNANCY, URINE   Imaging Review Dg Chest 2 View  03/23/2013   CLINICAL DATA:  Shortness of breath, palpitations  EXAM: CHEST  2 VIEW  COMPARISON:  12/28/2012  FINDINGS: The heart size and mediastinal contours are within normal limits. Both lungs are clear. The visualized skeletal structures are unremarkable.  IMPRESSION: No active cardiopulmonary disease.   Electronically Signed   By: Lahoma Crocker M.D.   On: 03/23/2013 12:13    EKG Interpretation    Date/Time:  Friday March 23 2013 11:25:38 EST Ventricular Rate:  102 PR Interval:  160 QRS Duration: 82 QT Interval:  366 QTC Calculation: 477 R Axis:   60 Text Interpretation:  Sinus tachycardia Nonspecific T wave abnormality Abnormal ECG No significant change since last tracing Confirmed by HORTON  MD, COURTNEY ([id number]) on 03/23/2013 4:33:24 PM            MDM   Final diagnoses:  Palpitations  SOB  (shortness of breath)    No acute abnormality noted today; considered pe although doubt.pt has appointment with pcp in 2 weeks:tsh pending    Glendell Docker, NP 03/23/13 1945

## 2013-03-25 LAB — URINE CULTURE: Colony Count: 65000

## 2013-04-24 ENCOUNTER — Emergency Department (HOSPITAL_COMMUNITY)
Admission: EM | Admit: 2013-04-24 | Discharge: 2013-04-24 | Disposition: A | Discharge status: Home / Self Care | Payer: No Typology Code available for payment source | Attending: Emergency Medicine | Admitting: Emergency Medicine

## 2013-04-24 ENCOUNTER — Encounter (HOSPITAL_COMMUNITY): Payer: Self-pay | Admitting: Emergency Medicine

## 2013-04-24 ENCOUNTER — Emergency Department (HOSPITAL_COMMUNITY): Payer: No Typology Code available for payment source

## 2013-04-24 DIAGNOSIS — W010XXA Fall on same level from slipping, tripping and stumbling without subsequent striking against object, initial encounter: Secondary | ICD-10-CM | POA: Insufficient documentation

## 2013-04-24 DIAGNOSIS — IMO0002 Reserved for concepts with insufficient information to code with codable children: Secondary | ICD-10-CM | POA: Insufficient documentation

## 2013-04-24 DIAGNOSIS — Z91018 Allergy to other foods: Secondary | ICD-10-CM | POA: Insufficient documentation

## 2013-04-24 DIAGNOSIS — S8392XA Sprain of unspecified site of left knee, initial encounter: Secondary | ICD-10-CM

## 2013-04-24 DIAGNOSIS — E669 Obesity, unspecified: Secondary | ICD-10-CM | POA: Insufficient documentation

## 2013-04-24 DIAGNOSIS — Z91038 Other insect allergy status: Secondary | ICD-10-CM | POA: Insufficient documentation

## 2013-04-24 DIAGNOSIS — Y9341 Activity, dancing: Secondary | ICD-10-CM | POA: Insufficient documentation

## 2013-04-24 DIAGNOSIS — M25569 Pain in unspecified knee: Secondary | ICD-10-CM | POA: Insufficient documentation

## 2013-04-24 DIAGNOSIS — Y929 Unspecified place or not applicable: Secondary | ICD-10-CM | POA: Insufficient documentation

## 2013-04-24 DIAGNOSIS — R609 Edema, unspecified: Secondary | ICD-10-CM | POA: Insufficient documentation

## 2013-04-24 DIAGNOSIS — Z91040 Latex allergy status: Secondary | ICD-10-CM | POA: Insufficient documentation

## 2013-04-24 MED ORDER — NAPROXEN 500 MG PO TABS: 500.0000 mg | ORAL_TABLET | Freq: Two times a day (BID) | ORAL | Status: DC

## 2013-04-24 MED ORDER — TRAMADOL HCL 50 MG PO TABS: 50.0000 mg | ORAL_TABLET | Freq: Four times a day (QID) | ORAL | Status: DC | PRN

## 2013-04-24 NOTE — Discharge Instructions (Signed)
Your x-ray today is normal. It is possible that you sprained your knee, however other internal injuries or not completely excluded at this time. Keep the leg elevated at home, ice several times a day, use crutches for several days as needed. Please followup with orthopedics Dr. if your pain is not improving for further imaging and testing.   Knee Sprain A knee sprain is a tear in one of the strong, fibrous tissues that connect the bones (ligaments) in your knee. The severity of the sprain depends on how much of the ligament is torn. The tear can be either partial or complete. CAUSES  Often, sprains are a result of a fall or injury. The force of the impact causes the fibers of your ligament to stretch too much. This excess tension causes the fibers of your ligament to tear. SIGNS AND SYMPTOMS  You may have some loss of motion in your knee. Other symptoms include:  Bruising.  Pain in the knee area.  Tenderness of the knee to the touch.  Swelling. DIAGNOSIS  To diagnose a knee sprain, your health care provider will physically examine your knee. Your health care provider may also suggest an X-ray exam of your knee to make sure no bones are broken. TREATMENT  If your ligament is only partially torn, treatment usually involves keeping the knee in a fixed position (immobilization) or bracing your knee for activities that require movement for several weeks. To do this, your health care provider will apply a bandage, cast, or splint to keep your knee from moving and to support your knee during movement until it heals. For a partially torn ligament, the healing process usually takes 4 6 weeks. If your ligament is completely torn, depending on which ligament it is, you may need surgery to reconnect the ligament to the bone or reconstruct it. After surgery, a cast or splint may be applied and will need to stay on your knee for 4 6 weeks while your ligament heals. HOME CARE INSTRUCTIONS  Keep your injured  knee elevated to decrease swelling.  To ease pain and swelling, apply ice to the injured area:  Put ice in a plastic bag.  Place a towel between your skin and the bag.  Leave the ice on for 20 minutes, 2 3 times a day.  Only take medicine for pain as directed by your health care provider.  Do not leave your knee unprotected until pain and stiffness go away (usually 4 6 weeks).  If you have a cast or splint, do not allow it to get wet. If you have been instructed not to remove it, cover it with a plastic bag when you shower or bathe. Do not swim.  Your health care provider may suggest exercises for you to do during your recovery to prevent or limit permanent weakness and stiffness. SEEK IMMEDIATE MEDICAL CARE IF:  Your cast or splint becomes damaged.  Your pain becomes worse.  You have significant pain, swelling, or numbness below the cast or splint. MAKE SURE YOU:  Understand these instructions.  Will watch your condition.  Will get help right away if you are not doing well or get worse. Document Released: 01/25/2005 Document Revised: 11/15/2012 Document Reviewed: 09/06/2012 [facility] Patient Information 2014 [place].

## 2013-04-24 NOTE — ED Provider Notes (Signed)
CSN: [id number]     Arrival date & time 04/24/13  1606 History  This chart was scribed for non-physician practitioner, Jeannett Senior, PA-C working with Ezequiel Essex, MD by Frederich Balding, ED scribe. This patient was seen in room TR06C/TR06C and the patient's care was started at 5:09 PM.    Chief Complaint  Patient presents with  . Knee Pain   The history is provided by the patient. No language interpreter was used.   HPI Comments: Bianca Dawson is a 28 y.o. female who presents to the Emergency Department complaining of left knee injury that occurred earlier today. She states she was doing lyrical dance and fell onto her left knee and twisted it. Pt has sudden onset left knee pain with associated swelling. Bearing weight and bending her knee worsen the pain. Pt has iced her knee and taken aleve with little relief. Denies prior injury to her knee.   Past Medical History  Diagnosis Date  . Obesity    Past Surgical History  Procedure Laterality Date  . Cholecystectomy     Family History  Problem Relation Age of Onset  . Diabetes Father   . Heart attack Father   . Hyperlipidemia Father   . Hypertension Father   . Sudden death Neg Hx    History  Substance Use Topics  . Smoking status: Never Smoker   . Smokeless tobacco: Not on file  . Alcohol Use: No   OB History   Grav Para Term Preterm Abortions TAB SAB Ect Mult Living                 Review of Systems  Musculoskeletal: Positive for arthralgias and joint swelling.  All other systems reviewed and are negative.   Allergies  Bee venom; Strawberry; Oxycontin; and Latex  Home Medications   Current Outpatient Rx  Name  Route  Sig  Dispense  Refill  . traMADol (ULTRAM) 50 MG tablet   Oral   Take 1 tablet (50 mg total) by mouth every 6 (six) hours as needed.   15 tablet   0    BP 130/67  Pulse 96  Temp(Src) 98.8 F (37.1 C) (Oral)  Resp 18  SpO2 97%  LMP 04/17/2013  Physical Exam  Nursing note and  vitals reviewed. Constitutional: She is oriented to person, place, and time. She appears well-developed and well-nourished. No distress.  HENT:  Head: Normocephalic and atraumatic.  Eyes: EOM are normal.  Neck: Neck supple. No tracheal deviation present.  Cardiovascular: Normal rate.   Pulmonary/Chest: Effort normal. No respiratory distress.  Musculoskeletal: Normal range of motion.  Normal appearing left knee. Tender to palpation over medial, lateral joints and over patella. Patella tendon intact pain with any flexion, able to extend all the way.  Negative anterior and posterior drawer signs. No laxity or pain with medial or lateral stress. Dorsal pedal pulses intact bilaterally. Ankle normal.    Neurological: She is alert and oriented to person, place, and time.  Skin: Skin is warm and dry.  Psychiatric: She has a normal mood and affect. Her behavior is normal.    ED Course  Procedures (including critical care time)  DIAGNOSTIC STUDIES: Oxygen Saturation is 97% on RA, normal by my interpretation.    COORDINATION OF CARE: 5:14 PM-Discussed treatment plan which includes knee xray with pt at bedside and pt agreed to plan.   Labs Review Labs Reviewed - No data to display Imaging Review Dg Knee Complete 4 Views Left  04/24/2013  CLINICAL DATA:  Lateral knee pain near patella, twisting injury  EXAM: LEFT KNEE - COMPLETE 4+ VIEW  COMPARISON:  03/21/2012  FINDINGS: Bone mineralization normal.  Joint spaces preserved.  No fracture, dislocation, or bone destruction.  No joint effusion.  IMPRESSION: Normal exam, unchanged.   Electronically Signed   By: Lavonia Dana M.D.   On: 04/24/2013 17:50     EKG Interpretation None      MDM   Final diagnoses:  Left knee sprain    Knee x-ray. Joint appears to be stable. Patient did have an imaging on the left knee in the past including MRI, however she denies injuring that knee, states she was supposed to have almost test on the right knee  instead. I this time she has pain weightbearing on the left leg. Plan is to apply Ace wrap for swelling, and support. Use crutches for ambulation. Followup with orthopedic doctor. Home with Naprosyn and Ultram.  Filed Vitals:   04/24/13 1625  BP: 130/67  Pulse: 96  Temp: 98.8 F (37.1 C)  TempSrc: Oral  Resp: 18  SpO2: 97%     I personally performed the services described in this documentation, which was scribed in my presence. The recorded information has been reviewed and is accurate.   Renold Genta, PA-C 04/24/13 1843

## 2013-04-24 NOTE — ED Notes (Signed)
Reports dancing and felt left knee twist and now having pain. Ambulatory at triage.

## 2013-04-24 NOTE — ED Notes (Signed)
Patient transported to X-ray 

## 2013-04-24 NOTE — Progress Notes (Signed)
Orthopedic Tech Progress Note Patient Details:  Bianca Dawson Feb 06, 1986 [id number]  Ortho Devices Type of Ortho Device: Ace wrap;Crutches Ortho Device/Splint Location: ace wrap RLE Ortho Device/Splint Interventions: Ordered;Adjustment;Application   Braulio Bosch 04/24/2013, 6:59 PM

## 2013-04-25 NOTE — ED Provider Notes (Signed)
Medical screening examination/treatment/procedure(s) were performed by non-physician practitioner and as supervising physician I was immediately available for consultation/collaboration.   EKG Interpretation None        Fatou Dunnigan, MD 04/25/13 0106 

## 2013-06-07 ENCOUNTER — Emergency Department (HOSPITAL_COMMUNITY)
Admission: EM | Admit: 2013-06-07 | Discharge: 2013-06-08 | Disposition: A | Discharge status: Home / Self Care | Payer: No Typology Code available for payment source | Attending: Emergency Medicine | Admitting: Emergency Medicine

## 2013-06-07 ENCOUNTER — Emergency Department (HOSPITAL_COMMUNITY): Payer: No Typology Code available for payment source

## 2013-06-07 ENCOUNTER — Encounter (HOSPITAL_COMMUNITY): Payer: Self-pay | Admitting: Emergency Medicine

## 2013-06-07 DIAGNOSIS — Z9089 Acquired absence of other organs: Secondary | ICD-10-CM | POA: Insufficient documentation

## 2013-06-07 DIAGNOSIS — R112 Nausea with vomiting, unspecified: Secondary | ICD-10-CM | POA: Insufficient documentation

## 2013-06-07 DIAGNOSIS — R197 Diarrhea, unspecified: Secondary | ICD-10-CM | POA: Insufficient documentation

## 2013-06-07 DIAGNOSIS — Z91040 Latex allergy status: Secondary | ICD-10-CM | POA: Insufficient documentation

## 2013-06-07 DIAGNOSIS — E669 Obesity, unspecified: Secondary | ICD-10-CM | POA: Insufficient documentation

## 2013-06-07 DIAGNOSIS — N83209 Unspecified ovarian cyst, unspecified side: Secondary | ICD-10-CM | POA: Insufficient documentation

## 2013-06-07 DIAGNOSIS — Z3202 Encounter for pregnancy test, result negative: Secondary | ICD-10-CM | POA: Insufficient documentation

## 2013-06-07 DIAGNOSIS — R109 Unspecified abdominal pain: Secondary | ICD-10-CM

## 2013-06-07 LAB — POC URINE PREG, ED: PREG TEST UR: NEGATIVE

## 2013-06-07 LAB — URINALYSIS, ROUTINE W REFLEX MICROSCOPIC
Bilirubin Urine: NEGATIVE
GLUCOSE, UA: NEGATIVE mg/dL
Ketones, ur: NEGATIVE mg/dL
LEUKOCYTES UA: NEGATIVE
NITRITE: NEGATIVE
PH: 6 (ref 5.0–8.0)
Protein, ur: NEGATIVE mg/dL
Specific Gravity, Urine: 1.028 (ref 1.005–1.030)
Urobilinogen, UA: 1 mg/dL (ref 0.0–1.0)

## 2013-06-07 LAB — COMPREHENSIVE METABOLIC PANEL
ALBUMIN: 3.6 g/dL (ref 3.5–5.2)
ALT: 19 U/L (ref 0–35)
AST: 18 U/L (ref 0–37)
Alkaline Phosphatase: 91 U/L (ref 39–117)
BUN: 9 mg/dL (ref 6–23)
CALCIUM: 9.4 mg/dL (ref 8.4–10.5)
CO2: 21 mEq/L (ref 19–32)
Chloride: 100 mEq/L (ref 96–112)
Creatinine, Ser: 0.89 mg/dL (ref 0.50–1.10)
GFR calc Af Amer: >90 mL/min (ref >90)
GFR calc non Af Amer: 89 mL/min — ABNORMAL LOW (ref >90)
Glucose, Bld: 138 mg/dL — ABNORMAL HIGH (ref 70–99)
Potassium: 4.1 mEq/L (ref 3.7–5.3)
SODIUM: 138 meq/L (ref 137–147)
TOTAL PROTEIN: 7.5 g/dL (ref 6.0–8.3)
Total Bilirubin: <0.2 mg/dL — ABNORMAL LOW (ref 0.3–1.2)

## 2013-06-07 LAB — CBC WITH DIFFERENTIAL/PLATELET
BASOS PCT: 0 % (ref 0–1)
Basophils Absolute: 0 10*3/uL (ref 0.0–0.1)
EOS ABS: 0.1 10*3/uL (ref 0.0–0.7)
EOS PCT: 1 % (ref 0–5)
HCT: 35.5 % — ABNORMAL LOW (ref 36.0–46.0)
Hemoglobin: 11 g/dL — ABNORMAL LOW (ref 12.0–15.0)
LYMPHS PCT: 24 % (ref 12–46)
Lymphs Abs: 2.1 10*3/uL (ref 0.7–4.0)
MCH: 24.6 pg — AB (ref 26.0–34.0)
MCHC: 31 g/dL (ref 30.0–36.0)
MCV: 79.4 fL (ref 78.0–100.0)
Monocytes Absolute: 0.5 10*3/uL (ref 0.1–1.0)
Monocytes Relative: 6 % (ref 3–12)
Neutro Abs: 6.2 10*3/uL (ref 1.7–7.7)
Neutrophils Relative %: 69 % (ref 43–77)
PLATELETS: 305 10*3/uL (ref 150–400)
RBC: 4.47 MIL/uL (ref 3.87–5.11)
RDW: 16.9 % — ABNORMAL HIGH (ref 11.5–15.5)
WBC: 8.9 10*3/uL (ref 4.0–10.5)

## 2013-06-07 LAB — URINE MICROSCOPIC-ADD ON

## 2013-06-07 LAB — LIPASE, BLOOD: Lipase: 39 U/L (ref 11–59)

## 2013-06-07 MED ORDER — SODIUM CHLORIDE 0.9 % IV SOLN
Freq: Once | INTRAVENOUS | Status: AC
  Administered 2013-06-07: 22:00:00 via INTRAVENOUS

## 2013-06-07 MED ORDER — ONDANSETRON HCL 4 MG/2ML IJ SOLN
4.0000 mg | Freq: Once | INTRAMUSCULAR | Status: AC
  Administered 2013-06-07: 4 mg via INTRAVENOUS
  Filled 2013-06-07: qty 2

## 2013-06-07 MED ORDER — MORPHINE SULFATE 4 MG/ML IJ SOLN
4.0000 mg | INTRAMUSCULAR | Status: DC | PRN
  Administered 2013-06-07 (×2): 4 mg via INTRAVENOUS
  Filled 2013-06-07 (×2): qty 1

## 2013-06-07 MED ORDER — IOHEXOL 300 MG/ML  SOLN
100.0000 mL | Freq: Once | INTRAMUSCULAR | Status: AC | PRN
  Administered 2013-06-07: 100 mL via INTRAVENOUS

## 2013-06-07 MED ORDER — SODIUM CHLORIDE 0.9 % IV BOLUS (SEPSIS)
500.0000 mL | Freq: Once | INTRAVENOUS | Status: AC
  Administered 2013-06-07: 500 mL via INTRAVENOUS

## 2013-06-07 NOTE — ED Notes (Addendum)
Pt alert, NAD, calm, interactive, resps e/u, speaking in clear complete sentences, no dyspnea noted.

## 2013-06-07 NOTE — ED Notes (Signed)
Pt reports having RLQ pain since last night, having nausea, chills, diarrhea and pain with urination.

## 2013-06-07 NOTE — ED Notes (Signed)
Tolerated IV stick, up to b/r to obtain urine sample.

## 2013-06-08 ENCOUNTER — Emergency Department (HOSPITAL_COMMUNITY): Payer: No Typology Code available for payment source

## 2013-06-08 MED ORDER — KETOROLAC TROMETHAMINE 30 MG/ML IJ SOLN
30.0000 mg | Freq: Once | INTRAMUSCULAR | Status: AC
  Administered 2013-06-08: 30 mg via INTRAVENOUS
  Filled 2013-06-08: qty 1

## 2013-06-08 MED ORDER — ONDANSETRON 4 MG PO TBDP: 4.0000 mg | ORAL_TABLET | Freq: Three times a day (TID) | ORAL | Status: DC | PRN

## 2013-06-08 MED ORDER — NAPROXEN 500 MG PO TABS: 500.0000 mg | ORAL_TABLET | Freq: Two times a day (BID) | ORAL | Status: DC

## 2013-06-08 MED ORDER — HYDROCODONE-ACETAMINOPHEN 5-325 MG PO TABS: 2.0000 | ORAL_TABLET | ORAL | Status: DC | PRN

## 2013-06-08 NOTE — ED Notes (Signed)
States pain is a 8/10 but it is tolerable and she does not want anything for it.

## 2013-06-08 NOTE — ED Provider Notes (Signed)
Patient with improving pain, ultrasound confirms cyst in the right adnexa and exophytic mass off the uterus, patient informed, stable for discharge  Bianca Acosta, MD 06/08/13 [phone]

## 2013-06-08 NOTE — ED Notes (Signed)
Pt. Does not want med for pain at this time.

## 2013-06-08 NOTE — ED Provider Notes (Signed)
CSN: [id number]     Arrival date & time 06/07/13  1521 History   First MD Initiated Contact with Patient 06/07/13 1941     Chief Complaint  Patient presents with  . Abdominal Pain  . Nausea      HPI  Patient presents with complaint of abdominal pain.  States she has an adult history of midline low does not have frequency or hematuria. No fever. Pain is more right-sided today. Worse with movement. Denies sexual activity or dyspareunia. Does have some discomfort with urination. No dysuria frequency or hematuria. Pain with movement. No past similar episodes. Past surgical history cholecystectomy.   Past Medical History  Diagnosis Date  . Obesity    Past Surgical History  Procedure Laterality Date  . Cholecystectomy     Family History  Problem Relation Age of Onset  . Diabetes Father   . Heart attack Father   . Hyperlipidemia Father   . Hypertension Father   . Sudden death Neg Hx    History  Substance Use Topics  . Smoking status: Never Smoker   . Smokeless tobacco: Not on file  . Alcohol Use: No   OB History   Grav Para Term Preterm Abortions TAB SAB Ect Mult Living                 Review of Systems  Constitutional: Negative for fever, chills, diaphoresis, appetite change and fatigue.  HENT: Negative for mouth sores, sore throat and trouble swallowing.   Eyes: Negative for visual disturbance.  Respiratory: Negative for cough, chest tightness, shortness of breath and wheezing.   Cardiovascular: Negative for chest pain.  Gastrointestinal: Positive for nausea, vomiting, abdominal pain and diarrhea. Negative for abdominal distention.  Endocrine: Negative for polydipsia, polyphagia and polyuria.  Genitourinary: Negative for dysuria, frequency and hematuria.  Musculoskeletal: Negative for gait problem.  Skin: Negative for color change, pallor and rash.  Neurological: Negative for dizziness, syncope, light-headedness and headaches.  Hematological: Does not bruise/bleed  easily.  Psychiatric/Behavioral: Negative for behavioral problems and confusion.      Allergies  Bee venom; Strawberry; Oxycontin; and Latex  Home Medications   Prior to Admission medications   Medication Sig Start Date End Date Taking? Authorizing Provider  meloxicam (MOBIC) 15 MG tablet Take 15 mg by mouth daily as needed for pain.   Yes Historical Provider, MD   BP 114/76  Pulse 87  Temp(Src) 98.7 F (37.1 C) (Oral)  Resp 20  Ht 5\' 7"  (1.702 m)  Wt 304 lb 4.8 oz (138.03 kg)  BMI 47.65 kg/m2  SpO2 97%  LMP 05/09/2013 Physical Exam  Constitutional: She is oriented to person, place, and time. She appears well-developed and well-nourished. No distress.  HENT:  Head: Normocephalic.  Eyes: Conjunctivae are normal. Pupils are equal, round, and reactive to light. No scleral icterus.  Neck: Normal range of motion. Neck supple. No thyromegaly present.  Cardiovascular: Normal rate and regular rhythm.  Exam reveals no gallop and no friction rub.   No murmur heard. Pulmonary/Chest: Effort normal and breath sounds normal. No respiratory distress. She has no wheezes. She has no rales.  Abdominal: Soft. Bowel sounds are normal. She exhibits no distension. There is no tenderness. There is no rigidity, no rebound, no guarding and no tenderness at McBurney's point.    Musculoskeletal: Normal range of motion.  Neurological: She is alert and oriented to person, place, and time.  Skin: Skin is warm and dry. No rash noted.  Psychiatric: She has a  normal mood and affect. Her behavior is normal.    ED Course  Procedures (including critical care time) Labs Review Labs Reviewed  CBC WITH DIFFERENTIAL - Abnormal; Notable for the following:    Hemoglobin 11.0 (*)    HCT 35.5 (*)    MCH 24.6 (*)    RDW 16.9 (*)    All other components within normal limits  COMPREHENSIVE METABOLIC PANEL - Abnormal; Notable for the following:    Glucose, Bld 138 (*)    Total Bilirubin <0.2 (*)    GFR calc  non Af Amer 89 (*)    All other components within normal limits  URINALYSIS, ROUTINE W REFLEX MICROSCOPIC - Abnormal; Notable for the following:    APPearance CLOUDY (*)    Hgb urine dipstick SMALL (*)    All other components within normal limits  URINE MICROSCOPIC-ADD ON - Abnormal; Notable for the following:    Squamous Epithelial / LPF FEW (*)    All other components within normal limits  LIPASE, BLOOD  POC URINE PREG, ED    Imaging Review No results found.   EKG Interpretation None      MDM   Final diagnoses:  Abdominal pain  Ovarian cyst    History concerning for appendicitis, exam is not classic. Pain is somewhat intermittent. Her pain is somewhat more medial. Has more pain or abdominal palpation than with pelvic. CT scan shows fluid density/soft tissue density mass anterior to the bladder. By CT to be ovarian in nature. Her white blood cell count. Normal urinalysis. Not pregnant. Ultrasound requested and being obtained. IV was placed. Given morphine. Minimal improvement. Given Dilaudid, and IV Toradol. With some improvement.  01:31:  Awaiting [place].  If ovarian cyst/hemorrhagic cyst, and + ovarian flow, tx would be symptom control, gyn f/u, expectant management.        Tanna Furry, MD 06/19/13 Drema Halon

## 2013-12-22 ENCOUNTER — Emergency Department (HOSPITAL_COMMUNITY)
Admission: EM | Admit: 2013-12-22 | Discharge: 2013-12-22 | Disposition: A | Discharge status: Home / Self Care | Payer: No Typology Code available for payment source | Attending: Emergency Medicine | Admitting: Emergency Medicine

## 2013-12-22 ENCOUNTER — Encounter (HOSPITAL_COMMUNITY): Payer: Self-pay | Admitting: Cardiology

## 2013-12-22 DIAGNOSIS — K088 Other specified disorders of teeth and supporting structures: Secondary | ICD-10-CM | POA: Insufficient documentation

## 2013-12-22 DIAGNOSIS — Z791 Long term (current) use of non-steroidal anti-inflammatories (NSAID): Secondary | ICD-10-CM | POA: Insufficient documentation

## 2013-12-22 DIAGNOSIS — E669 Obesity, unspecified: Secondary | ICD-10-CM | POA: Insufficient documentation

## 2013-12-22 DIAGNOSIS — K0889 Other specified disorders of teeth and supporting structures: Secondary | ICD-10-CM

## 2013-12-22 DIAGNOSIS — Z91040 Latex allergy status: Secondary | ICD-10-CM | POA: Insufficient documentation

## 2013-12-22 DIAGNOSIS — R6883 Chills (without fever): Secondary | ICD-10-CM | POA: Insufficient documentation

## 2013-12-22 MED ORDER — PENICILLIN V POTASSIUM 500 MG PO TABS: 500.0000 mg | ORAL_TABLET | Freq: Four times a day (QID) | ORAL | Status: DC

## 2013-12-22 MED ORDER — HYDROCODONE-ACETAMINOPHEN 5-325 MG PO TABS: 1.0000 | ORAL_TABLET | Freq: Four times a day (QID) | ORAL | Status: DC | PRN

## 2013-12-22 MED ORDER — ONDANSETRON 4 MG PO TBDP
8.0000 mg | ORAL_TABLET | Freq: Once | ORAL | Status: AC
  Administered 2013-12-22: 8 mg via ORAL
  Filled 2013-12-22: qty 2

## 2013-12-22 MED ORDER — HYDROCODONE-ACETAMINOPHEN 5-325 MG PO TABS
2.0000 | ORAL_TABLET | Freq: Once | ORAL | Status: AC
  Administered 2013-12-22: 2 via ORAL
  Filled 2013-12-22: qty 2

## 2013-12-22 NOTE — Discharge Instructions (Signed)
Dental Pain °A tooth ache may be caused by cavities (tooth decay). Cavities expose the nerve of the tooth to air and hot or cold temperatures. It may come from an infection or abscess (also called a boil or furuncle) around your tooth. It is also often caused by dental caries (tooth decay). This causes the pain you are having. °DIAGNOSIS  °Your caregiver can diagnose this problem by exam. °TREATMENT  °· If caused by an infection, it may be treated with medications which kill germs (antibiotics) and pain medications as prescribed by your caregiver. Take medications as directed. °· Only take over-the-counter or prescription medicines for pain, discomfort, or fever as directed by your caregiver. °· Whether the tooth ache today is caused by infection or dental disease, you should see your dentist as soon as possible for further care. °SEEK MEDICAL CARE IF: °The exam and treatment you received today has been provided on an emergency basis only. This is not a substitute for complete medical or dental care. If your problem worsens or new problems (symptoms) appear, and you are unable to meet with your dentist, call or return to this location. °SEEK IMMEDIATE MEDICAL CARE IF:  °· You have a fever. °· You develop redness and swelling of your face, jaw, or neck. °· You are unable to open your mouth. °· You have severe pain uncontrolled by pain medicine. °MAKE SURE YOU:  °· Understand these instructions. °· Will watch your condition. °· Will get help right away if you are not doing well or get worse. °Document Released: 01/25/2005 Document Revised: 04/19/2011 Document Reviewed: 09/13/2007 °[employer]® Patient Information ©2015 [employer]. This information is not intended to replace advice given to you by your health care provider. Make sure you discuss any questions you have with your health care provider. °Return to the emergency room for fever, change in vision, redness to the face that rapidly spreads towards the eye,  nausea or vomiting, difficulty swallowing or shortness of breath. ° °You may follow with the dentist listed above. Alternatively,  you may also contact [facility] who run a low-cost dental clinic at their phone number is [phone]. You may also call [phone] ° °Dental Assistance °If the dentist on-call cannot see you, please use the resources below: ° ° °Patients with Medicaid: [facility] °[address], [phone] °[address], [phone] ° °If unable to pay, or uninsured, contact [facility] ([phone]) or [facility] ([phone] in [place], [phone] in [place]) to become qualified for the adult dental clinic ° °Other Low-Cost Community Dental Services: °[facility]- [address], [place], [place], [address] °   [phone], Ext. [phone] °   2nd and 4th Thursday of the month at 6:30am °   10 clients each day by appointment, can sometimes see walk-in     patients if someone does not show for an appointment °[facility]- [address], [place], [place], [address] °   [phone] °[facility]- [address], [place], [place], [address] °   [phone] ° °[facility]- [phone] °[facility]- [phone] ° [facility]- [phone] ° °

## 2013-12-22 NOTE — ED Provider Notes (Signed)
CSN: [id number]     Arrival date & time 12/22/13  7628 History  This chart was scribed for Cleatrice Burke, PA-C working with Fredia Sorrow, MD by Judithann Sauger, ED Scribe. The patient was seen in room TR06C/TR06C and the patient's care was started at 10:34 AM    Chief Complaint  Patient presents with  . Dental Pain   The history is provided by the patient. No language interpreter was used.   HPI Comments: Bianca Dawson is a 28 y.o. female who presents to the Emergency Department complaining of a top right side tooth pain onset 4 days ago. She reports associated jaw pain and chills. She explains the pain as a "pressure". She reports not being able to eat for 3 days due to the pain. She has associated cold sensitivity. She denies difficulty swallowing, or fever. She has taken 500 mg Tylenol, every 4 hours since onset and then took 2 tablets of either 200 mg or 400 mg Ibuprofen.   Past Medical History  Diagnosis Date  . Obesity    Past Surgical History  Procedure Laterality Date  . Cholecystectomy     Family History  Problem Relation Age of Onset  . Diabetes Father   . Heart attack Father   . Hyperlipidemia Father   . Hypertension Father   . Sudden death Neg Hx    History  Substance Use Topics  . Smoking status: Never Smoker   . Smokeless tobacco: Not on file  . Alcohol Use: No   OB History    No data available     Review of Systems  Constitutional: Positive for chills. Negative for fever.  HENT: Positive for dental problem. Negative for sore throat.   Respiratory: Negative for shortness of breath.   Gastrointestinal: Negative for nausea, vomiting and abdominal pain.  All other systems reviewed and are negative.     Allergies  Bee venom; Strawberry; Oxycontin; and Latex  Home Medications   Prior to Admission medications   Medication Sig Start Date End Date Taking? Authorizing Provider  HYDROcodone-acetaminophen (NORCO/VICODIN) 5-325 MG per tablet Take 2  tablets by mouth every 4 (four) hours as needed. 06/08/13   Tanna Furry, MD  meloxicam (MOBIC) 15 MG tablet Take 15 mg by mouth daily as needed for pain.    Historical Provider, MD  naproxen (NAPROSYN) 500 MG tablet Take 1 tablet (500 mg total) by mouth 2 (two) times daily. 06/08/13   Tanna Furry, MD  ondansetron (ZOFRAN ODT) 4 MG disintegrating tablet Take 1 tablet (4 mg total) by mouth every 8 (eight) hours as needed for nausea. 06/08/13   Tanna Furry, MD   BP 148/98 mmHg  Pulse 91  Temp(Src) 98.3 F (36.8 C) (Oral)  Resp 18  Ht 5\' 7"  (1.702 m)  Wt 280 lb (127.007 kg)  BMI 43.84 kg/m2  SpO2 100%  LMP 12/16/2013 Physical Exam  Constitutional: She is oriented to person, place, and time. She appears well-developed and well-nourished. No distress.  Obese  HENT:  Head: Normocephalic and atraumatic.  Right Ear: External ear normal.  Left Ear: External ear normal.  Nose: Nose normal.  Mouth/Throat: Oropharynx is clear and moist.    No trismus, submental edema, or tongue elevation. No drainable abscess   Eyes: Conjunctivae are normal.  Neck: Normal range of motion.  Cardiovascular: Normal rate, regular rhythm and normal heart sounds.   Pulmonary/Chest: Effort normal and breath sounds normal. No stridor. No respiratory distress. She has no wheezes. She has no rales.  Abdominal: Soft. She exhibits no distension.  Musculoskeletal: Normal range of motion.  Neurological: She is alert and oriented to person, place, and time. She has normal strength.  Skin: Skin is warm and dry. She is not diaphoretic. No erythema.  Psychiatric: She has a normal mood and affect. Her behavior is normal.  Nursing note and vitals reviewed.   ED Course  Procedures (including critical care time) DIAGNOSTIC STUDIES: Oxygen Saturation is 100% on RA, normal by my interpretation.    COORDINATION OF CARE: 10:37 AM- Pt advised of plan for treatment and pt agrees.    Labs Review Labs Reviewed - No data to  display  Imaging Review No results found.   EKG Interpretation None      MDM   Final diagnoses:  Pain, dental    Patient with toothache.  No gross abscess.  Exam unconcerning for Ludwig's angina or spread of infection.  Will treat with penicillin and pain medicine.  Urged patient to follow-up with dentist.     I personally performed the services described in this documentation, which was scribed in my presence. The recorded information has been reviewed and is accurate.    Elwyn Lade, PA-C 12/22/13 1049  Fredia Sorrow, MD 12/22/13 1231

## 2013-12-22 NOTE — ED Notes (Signed)
Pt reports right sided dental pain for 4 days. States she has been able to eat because of the pain.

## 2013-12-22 NOTE — ED Notes (Signed)
Right side dental pain and swelling since Thursday sts hasnt seen dentist due to lack of insurance.

## 2014-05-16 ENCOUNTER — Encounter (HOSPITAL_COMMUNITY): Payer: Self-pay | Admitting: Emergency Medicine

## 2014-05-16 ENCOUNTER — Emergency Department (HOSPITAL_COMMUNITY)
Admission: EM | Admit: 2014-05-16 | Discharge: 2014-05-16 | Disposition: A | Discharge status: Home / Self Care | Payer: Self-pay | Attending: Emergency Medicine | Admitting: Emergency Medicine

## 2014-05-16 ENCOUNTER — Emergency Department (HOSPITAL_COMMUNITY): Payer: No Typology Code available for payment source

## 2014-05-16 DIAGNOSIS — Z7951 Long term (current) use of inhaled steroids: Secondary | ICD-10-CM | POA: Insufficient documentation

## 2014-05-16 DIAGNOSIS — R630 Anorexia: Secondary | ICD-10-CM | POA: Insufficient documentation

## 2014-05-16 DIAGNOSIS — M791 Myalgia: Secondary | ICD-10-CM | POA: Insufficient documentation

## 2014-05-16 DIAGNOSIS — Z91040 Latex allergy status: Secondary | ICD-10-CM | POA: Insufficient documentation

## 2014-05-16 DIAGNOSIS — R6889 Other general symptoms and signs: Secondary | ICD-10-CM

## 2014-05-16 DIAGNOSIS — R52 Pain, unspecified: Secondary | ICD-10-CM | POA: Insufficient documentation

## 2014-05-16 DIAGNOSIS — E669 Obesity, unspecified: Secondary | ICD-10-CM | POA: Insufficient documentation

## 2014-05-16 DIAGNOSIS — R112 Nausea with vomiting, unspecified: Secondary | ICD-10-CM | POA: Insufficient documentation

## 2014-05-16 DIAGNOSIS — Z791 Long term (current) use of non-steroidal anti-inflammatories (NSAID): Secondary | ICD-10-CM | POA: Insufficient documentation

## 2014-05-16 DIAGNOSIS — R599 Enlarged lymph nodes, unspecified: Secondary | ICD-10-CM | POA: Insufficient documentation

## 2014-05-16 DIAGNOSIS — R531 Weakness: Secondary | ICD-10-CM | POA: Insufficient documentation

## 2014-05-16 DIAGNOSIS — Z792 Long term (current) use of antibiotics: Secondary | ICD-10-CM | POA: Insufficient documentation

## 2014-05-16 DIAGNOSIS — R0981 Nasal congestion: Secondary | ICD-10-CM | POA: Insufficient documentation

## 2014-05-16 DIAGNOSIS — R05 Cough: Secondary | ICD-10-CM | POA: Insufficient documentation

## 2014-05-16 DIAGNOSIS — R000 Tachycardia, unspecified: Secondary | ICD-10-CM | POA: Insufficient documentation

## 2014-05-16 HISTORY — DX: Hypothyroidism, unspecified: E03.9

## 2014-05-16 MED ORDER — IBUPROFEN 400 MG PO TABS
800.0000 mg | ORAL_TABLET | Freq: Once | ORAL | Status: AC
  Administered 2014-05-16: 800 mg via ORAL
  Filled 2014-05-16: qty 2

## 2014-05-16 MED ORDER — FLUTICASONE PROPIONATE 50 MCG/ACT NA SUSP: 2.0000 | Freq: Every day | NASAL | Status: AC

## 2014-05-16 NOTE — ED Notes (Signed)
Pt c/o generalized body aches onset last pm.  Productive cough, c/o pain in her chest when she coughs.  Nausea with vomiting

## 2014-05-16 NOTE — Discharge Instructions (Signed)
Rest and stay well-hydrated. Use nasal spray as directed. Continue taking over-the-counter medications as needed.  Influenza Influenza ("the flu") is a viral infection of the respiratory tract. It occurs more often in winter months because people spend more time in close contact with one another. Influenza can make you feel very sick. Influenza easily spreads from person to person (contagious). CAUSES  Influenza is caused by a virus that infects the respiratory tract. You can catch the virus by breathing in droplets from an infected person's cough or sneeze. You can also catch the virus by touching something that was recently contaminated with the virus and then touching your mouth, nose, or eyes. RISKS AND COMPLICATIONS You may be at risk for a more severe case of influenza if you smoke cigarettes, have diabetes, have chronic heart disease (such as heart failure) or lung disease (such as asthma), or if you have a weakened immune system. Elderly people and pregnant women are also at risk for more serious infections. The most common problem of influenza is a lung infection (pneumonia). Sometimes, this problem can require emergency medical care and may be life threatening. SIGNS AND SYMPTOMS  Symptoms typically last 4 to 10 days and may include:  Fever.  Chills.  Headache, body aches, and muscle aches.  Sore throat.  Chest discomfort and cough.  Poor appetite.  Weakness or feeling tired.  Dizziness.  Nausea or vomiting. DIAGNOSIS  Diagnosis of influenza is often made based on your history and a physical exam. A nose or throat swab test can be done to confirm the diagnosis. TREATMENT  In mild cases, influenza goes away on its own. Treatment is directed at relieving symptoms. For more severe cases, your health care provider may prescribe antiviral medicines to shorten the sickness. Antibiotic medicines are not effective because the infection is caused by a virus, not by bacteria. HOME  CARE INSTRUCTIONS  Take medicines only as directed by your health care provider.  Use a cool mist humidifier to make breathing easier.  Get plenty of rest until your temperature returns to normal. This usually takes 3 to 4 days.  Drink enough fluid to keep your urine clear or pale yellow.  Cover yourmouth and nosewhen coughing or sneezing,and wash your handswellto prevent thevirusfrom spreading.  Stay homefromwork orschool untilthe fever is gonefor at least 67full day. PREVENTION  An annual influenza vaccination (flu shot) is the best way to avoid getting influenza. An annual flu shot is now routinely recommended for all adults in the [place] IF:  You experiencechest pain, yourcough worsens,or you producemore mucus.  Youhave nausea,vomiting, ordiarrhea.  Your fever returns or gets worse. SEEK IMMEDIATE MEDICAL CARE IF:  You havetrouble breathing, you become short of breath,or your skin ornails becomebluish.  You have severe painor stiffnessin the neck.  You develop a sudden headache, or pain in the face or ear.  You have nausea or vomiting that you cannot control. MAKE SURE YOU:   Understand these instructions.  Will watch your condition.  Will get help right away if you are not doing well or get worse. Document Released: 01/23/2000 Document Revised: 06/11/2013 Document Reviewed: 04/26/2011 [facility] Patient Information 2015 [place], [place]. This information is not intended to replace advice given to you by your health care provider. Make sure you discuss any questions you have with your health care provider.

## 2014-05-16 NOTE — ED Provider Notes (Signed)
CSN: [id number]     Arrival date & time 05/16/14  1456 History  This chart was scribed for a non-physician practitioner, Carman Ching, PA-C working with Merryl Hacker, MD by Martinique Peace, ED Scribe. The patient was seen in TR03C/TR03C. The patient's care was started at 3:32 PM.    Chief Complaint  Patient presents with  . Generalized Body Aches      Patient is a 29 y.o. female presenting with cough and vomiting. The history is provided by the patient. No language interpreter was used.  Cough Cough characteristics:  Productive Sputum characteristics:  Yellow Severity:  Mild Onset quality:  Gradual Timing:  Constant Progression:  Unchanged Chronicity:  New Smoker: no   Associated symptoms: myalgias   Emesis Severity:  Mild Duration:  1 day Timing:  Sporadic Number of daily episodes:  3 Able to tolerate:  Liquids and solids Progression:  Resolved Chronicity:  New Recent urination:  Normal Associated symptoms: myalgias   Associated symptoms: no abdominal pain   HPI Comments: Jamee Keach is a 29 y.o. female who presents to the Emergency Department complaining of generalized body aches x 1 day with productive cough, ear pain, loss of appetite, congestion, and nausea. Endorses slight generalized chest pain after coughing. She also reports 3 episodes of vomiting today. Pt states she has been taking Tylenol to address symptoms but explains her mother advised her to stop but because she was taking a lot of it (1000mg  2x today). Pt adds that her co-workers have been very sick as well. History of DM and hypertension.    Past Medical History  Diagnosis Date  . Obesity   . Hypothyroid    Past Surgical History  Procedure Laterality Date  . Cholecystectomy    . Tonsillectomy    . Breast lumpectomy     Family History  Problem Relation Age of Onset  . Diabetes Father   . Heart attack Father   . Hyperlipidemia Father   . Hypertension Father   . Sudden death Neg Hx     History  Substance Use Topics  . Smoking status: Never Smoker   . Smokeless tobacco: Not on file  . Alcohol Use: No   OB History    No data available     Review of Systems  Constitutional: Positive for appetite change.  HENT: Positive for congestion.   Respiratory: Positive for cough.   Gastrointestinal: Positive for nausea and vomiting. Negative for abdominal pain.  Musculoskeletal: Positive for myalgias.  Neurological: Positive for weakness.  All other systems reviewed and are negative.     Allergies  Bee venom; Strawberry; Oxycontin; and Latex  Home Medications   Prior to Admission medications   Medication Sig Start Date End Date Taking? Authorizing Provider  fluticasone (FLONASE) 50 MCG/ACT nasal spray Place 2 sprays into both nostrils daily. 05/16/14   Carman Ching, PA-C  HYDROcodone-acetaminophen (NORCO/VICODIN) 5-325 MG per tablet Take 1-2 tablets by mouth every 6 (six) hours as needed for moderate pain or severe pain. 12/22/13   Cleatrice Burke, PA-C  meloxicam (MOBIC) 15 MG tablet Take 15 mg by mouth daily as needed for pain.    Historical Provider, MD  naproxen (NAPROSYN) 500 MG tablet Take 1 tablet (500 mg total) by mouth 2 (two) times daily. 06/08/13   Tanna Furry, MD  ondansetron (ZOFRAN ODT) 4 MG disintegrating tablet Take 1 tablet (4 mg total) by mouth every 8 (eight) hours as needed for nausea. 06/08/13   Tanna Furry, MD  penicillin v potassium (VEETID) 500 MG tablet Take 1 tablet (500 mg total) by mouth 4 (four) times daily. 12/22/13   Cleatrice Burke, PA-C   BP 131/85 mmHg  Pulse 112  Temp(Src) 98.5 F (36.9 C) (Oral)  Resp 24  Ht 5' 7.75" (1.721 m)  Wt 283 lb 4 oz (128.481 kg)  BMI 43.38 kg/m2  SpO2 97%  LMP 05/05/2014 Physical Exam  Constitutional: She is oriented to person, place, and time. She appears well-developed and well-nourished.  HENT:  Head: Normocephalic and atraumatic.  Nasal congestion, mucosal edema, postnasal drip.  Eyes: EOM are normal.   Neck: Normal range of motion.  Cardiovascular:  Mild tachycardia.  Pulmonary/Chest: Effort normal.  Abdominal: Soft. Bowel sounds are normal. She exhibits no distension. There is no tenderness.  Musculoskeletal: Normal range of motion.  Lymphadenopathy:    She has cervical adenopathy.  Neurological: She is alert and oriented to person, place, and time.  Skin: Skin is warm and dry.  Psychiatric: She has a normal mood and affect. Her behavior is normal.  Nursing note and vitals reviewed.   ED Course  Procedures (including critical care time) Labs Review Labs Reviewed - No data to display  Imaging Review Dg Chest 2 View  05/16/2014   CLINICAL DATA:  Generalized body ache, cough, fatigue  EXAM: CHEST  2 VIEW  COMPARISON:  03/23/2013  FINDINGS: Cardiomediastinal silhouette is stable. No acute infiltrate or pleural effusion. No pulmonary edema. Bony thorax is unremarkable.  IMPRESSION: No active cardiopulmonary disease.   Electronically Signed   By: Lahoma Crocker M.D.   On: 05/16/2014 16:47     EKG Interpretation None     Medications  ibuprofen (ADVIL,MOTRIN) tablet 800 mg (800 mg Oral Given 05/16/14 1545)    3:37 PM- Treatment plan was discussed with patient who verbalizes understanding and agrees.   MDM   Final diagnoses:  Flu-like symptoms   Nontoxic appearing, NAD. Tachycardic on arrival, vitals otherwise stable. Tolerating by mouth fluids in the ED without difficulty. Chest x-ray obtained given productive cough and stated pain in her chest when she coughs. Chest x-ray negative. After receiving ibuprofen, patient reports she is starting to feel better. Heart rate improved with oral fluids and ibuprofen. Discussed Tamiflu in detail with patient given that her symptoms have been present for only 24 hours. She would prefer to let the virus run its course without any medication. Discussed symptomatic treatment. Stable for discharge. Return precautions given. Patient states understanding  of treatment care plan and is agreeable.  I personally performed the services described in this documentation, which was scribed in my presence. The recorded information has been reviewed and is accurate.   Carman Ching, PA-C 05/16/14 1714  Merryl Hacker, MD 05/16/14 [phone]

## 2014-05-30 ENCOUNTER — Emergency Department (HOSPITAL_COMMUNITY)
Admission: EM | Admit: 2014-05-30 | Discharge: 2014-05-31 | Disposition: A | Discharge status: Home / Self Care | Payer: No Typology Code available for payment source | Attending: Emergency Medicine | Admitting: Emergency Medicine

## 2014-05-30 ENCOUNTER — Emergency Department (HOSPITAL_COMMUNITY): Payer: No Typology Code available for payment source

## 2014-05-30 ENCOUNTER — Encounter (HOSPITAL_COMMUNITY): Payer: Self-pay

## 2014-05-30 DIAGNOSIS — R519 Headache, unspecified: Secondary | ICD-10-CM

## 2014-05-30 DIAGNOSIS — Z7951 Long term (current) use of inhaled steroids: Secondary | ICD-10-CM | POA: Insufficient documentation

## 2014-05-30 DIAGNOSIS — E669 Obesity, unspecified: Secondary | ICD-10-CM | POA: Insufficient documentation

## 2014-05-30 DIAGNOSIS — R51 Headache: Secondary | ICD-10-CM | POA: Insufficient documentation

## 2014-05-30 DIAGNOSIS — R55 Syncope and collapse: Secondary | ICD-10-CM | POA: Insufficient documentation

## 2014-05-30 DIAGNOSIS — Z91040 Latex allergy status: Secondary | ICD-10-CM | POA: Insufficient documentation

## 2014-05-30 DIAGNOSIS — Z8639 Personal history of other endocrine, nutritional and metabolic disease: Secondary | ICD-10-CM | POA: Insufficient documentation

## 2014-05-30 LAB — CBC WITH DIFFERENTIAL/PLATELET
BASOS ABS: 0 10*3/uL (ref 0.0–0.1)
BASOS PCT: 0 % (ref 0–1)
EOS PCT: 2 % (ref 0–5)
Eosinophils Absolute: 0.1 10*3/uL (ref 0.0–0.7)
HCT: 32.5 % — ABNORMAL LOW (ref 36.0–46.0)
Hemoglobin: 10 g/dL — ABNORMAL LOW (ref 12.0–15.0)
LYMPHS ABS: 3.2 10*3/uL (ref 0.7–4.0)
LYMPHS PCT: 35 % (ref 12–46)
MCH: 24.9 pg — ABNORMAL LOW (ref 26.0–34.0)
MCHC: 30.8 g/dL (ref 30.0–36.0)
MCV: 80.8 fL (ref 78.0–100.0)
Monocytes Absolute: 0.5 10*3/uL (ref 0.1–1.0)
Monocytes Relative: 6 % (ref 3–12)
Neutro Abs: 5.3 10*3/uL (ref 1.7–7.7)
Neutrophils Relative %: 57 % (ref 43–77)
PLATELETS: 419 10*3/uL — AB (ref 150–400)
RBC: 4.02 MIL/uL (ref 3.87–5.11)
RDW: 16.3 % — AB (ref 11.5–15.5)
WBC: 9.3 10*3/uL (ref 4.0–10.5)

## 2014-05-30 LAB — I-STAT BETA HCG BLOOD, ED (MC, WL, AP ONLY): I-stat hCG, quantitative: <5 m[IU]/mL (ref <5)

## 2014-05-30 MED ORDER — DIPHENHYDRAMINE HCL 50 MG/ML IJ SOLN
25.0000 mg | Freq: Once | INTRAMUSCULAR | Status: AC
  Administered 2014-05-30: 25 mg via INTRAVENOUS
  Filled 2014-05-30: qty 1

## 2014-05-30 MED ORDER — KETOROLAC TROMETHAMINE 30 MG/ML IJ SOLN
30.0000 mg | Freq: Once | INTRAMUSCULAR | Status: AC
  Administered 2014-05-31: 30 mg via INTRAVENOUS
  Filled 2014-05-30: qty 1

## 2014-05-30 MED ORDER — SODIUM CHLORIDE 0.9 % IV BOLUS (SEPSIS)
1000.0000 mL | Freq: Once | INTRAVENOUS | Status: AC
  Administered 2014-05-30: 1000 mL via INTRAVENOUS

## 2014-05-30 MED ORDER — METOCLOPRAMIDE HCL 5 MG/ML IJ SOLN
10.0000 mg | Freq: Once | INTRAMUSCULAR | Status: AC
  Administered 2014-05-30: 10 mg via INTRAVENOUS
  Filled 2014-05-30: qty 2

## 2014-05-30 NOTE — ED Notes (Signed)
Patient reports she had a headache that started this afternoon.  She reports a syncopal episode, followed by a nosebleed,  around 1900.  Denies dizziness at present time.  No history of hypertension.  Current BP: 157/104.

## 2014-05-30 NOTE — ED Provider Notes (Signed)
CSN: [id number]     Arrival date & time 05/30/14  2102 History   First MD Initiated Contact with Patient 05/30/14 2129     Chief Complaint  Patient presents with  . Headache    The history is provided by the patient. No language interpreter was used.   Bianca Dawson presens for evaluation of headache and syncope.  She developed a headache around two pm today.  It is frontal and posterior and was gradual in onset.  At 6pm she was at work and the headache worsened and she had a syncopal event.  She awoke on the floor with blood from her nose.  Her headache waxes and wanes but is worse with light and sound.  She denies fevers, vomiting, numbness, weakness, chest pain, SOB, leg swelling.  She does not get routing headaches.  Sxs are moderate, constant, worsening.    Past Medical History  Diagnosis Date  . Obesity   . Hypothyroid    Past Surgical History  Procedure Laterality Date  . Cholecystectomy    . Tonsillectomy    . Breast lumpectomy     Family History  Problem Relation Age of Onset  . Diabetes Father   . Heart attack Father   . Hyperlipidemia Father   . Hypertension Father   . Sudden death Neg Hx    History  Substance Use Topics  . Smoking status: Never Smoker   . Smokeless tobacco: Not on file  . Alcohol Use: No   OB History    No data available     Review of Systems  All other systems reviewed and are negative.     Allergies  Bee venom; Strawberry; Oxycontin; and Latex  Home Medications   Prior to Admission medications   Medication Sig Start Date End Date Taking? Authorizing Provider  fluticasone (FLONASE) 50 MCG/ACT nasal spray Place 2 sprays into both nostrils daily. Patient not taking: Reported on 05/30/2014 05/16/14   Carman Ching, PA-C  HYDROcodone-acetaminophen (NORCO/VICODIN) 5-325 MG per tablet Take 1-2 tablets by mouth every 6 (six) hours as needed for moderate pain or severe pain. Patient not taking: Reported on 05/30/2014 12/22/13   Cleatrice Burke,  PA-C  naproxen (NAPROSYN) 500 MG tablet Take 1 tablet (500 mg total) by mouth 2 (two) times daily. Patient not taking: Reported on 05/30/2014 06/08/13   Tanna Furry, MD  ondansetron (ZOFRAN ODT) 4 MG disintegrating tablet Take 1 tablet (4 mg total) by mouth every 8 (eight) hours as needed for nausea. Patient not taking: Reported on 05/30/2014 06/08/13   Tanna Furry, MD  penicillin v potassium (VEETID) 500 MG tablet Take 1 tablet (500 mg total) by mouth 4 (four) times daily. Patient not taking: Reported on 05/30/2014 12/22/13   Cleatrice Burke, PA-C   BP 157/104 mmHg  Pulse 87  Temp(Src) 98.2 F (36.8 C) (Oral)  Resp 22  SpO2 100%  LMP 05/05/2014 Physical Exam  Constitutional: She is oriented to person, place, and time. She appears well-developed and well-nourished.  HENT:  Head: Normocephalic and atraumatic.  Mouth/Throat: Oropharynx is clear and moist.  Eyes: EOM are normal. Pupils are equal, round, and reactive to light.  photophobia  Cardiovascular: Normal rate and regular rhythm.   No murmur heard. Pulmonary/Chest: Effort normal and breath sounds normal. No respiratory distress.  Abdominal: Soft. There is no tenderness. There is no rebound and no guarding.  Musculoskeletal: She exhibits no edema or tenderness.  Neurological: She is alert and oriented to person, place, and time.  No cranial nerve deficit.  Skin: Skin is warm and dry.  Psychiatric: She has a normal mood and affect. Her behavior is normal.  Nursing note and vitals reviewed.   ED Course  Procedures (including critical care time) Labs Review Labs Reviewed  CBC WITH DIFFERENTIAL/PLATELET - Abnormal; Notable for the following:    Hemoglobin 10.0 (*)    HCT 32.5 (*)    MCH 24.9 (*)    RDW 16.3 (*)    Platelets 419 (*)    All other components within normal limits  BASIC METABOLIC PANEL  TROPONIN I  I-STAT BETA HCG BLOOD, ED ([facility], [facility], [facility] ONLY)    Imaging Review Ct Head Wo Contrast  05/30/2014   CLINICAL DATA:   Headache, syncope.  Symptoms began this afternoon.  EXAM: CT HEAD WITHOUT CONTRAST  TECHNIQUE: Contiguous axial images were obtained from the base of the skull through the vertex without intravenous contrast.  COMPARISON:  None.  FINDINGS: No acute intracranial abnormality. Specifically, no hemorrhage, hydrocephalus, mass lesion, acute infarction, or significant intracranial injury. No acute calvarial abnormality. Visualized paranasal sinuses and mastoids clear. Orbital soft tissues unremarkable.  IMPRESSION: Negative   Electronically Signed   By: Rolm Baptise M.D.   On: 05/30/2014 22:52     EKG Interpretation   Date/Time:  Thursday May 30 2014 23:16:08 EDT Ventricular Rate:  84 PR Interval:  179 QRS Duration: 88 QT Interval:  367 QTC Calculation: 434 R Axis:   47 Text Interpretation:  Sinus rhythm nonspecific ST changes, no significant  change since last tracing Confirmed by Hazle Coca [phone]) on 05/30/2014  11:24:13 PM      MDM   Final diagnoses:  Syncope, unspecified syncope type  Acute nonintractable headache, unspecified headache type    Patient is here for evaluation of headache, had a syncopal event injured her head after the headache started at present. History of presentation is not consistent with meningitis, subarachnoid hemorrhage. Providing treatment for headache and checking routine labs giving syncopal event. Presentation is not consistent with PE, ACS. EKG with nonspecific changes that were present on previous evaluation.   Quintella Reichert, MD 06/03/14 [phone]

## 2014-05-30 NOTE — ED Notes (Signed)
EKG delayed.  Pt went to xray.

## 2014-05-30 NOTE — ED Notes (Signed)
Delay on lab draw, pt currently in exray

## 2014-05-31 LAB — BASIC METABOLIC PANEL
ANION GAP: 6 (ref 5–15)
BUN: 7 mg/dL (ref 6–23)
CHLORIDE: 104 mmol/L (ref 96–112)
CO2: 28 mmol/L (ref 19–32)
Calcium: 8.6 mg/dL (ref 8.4–10.5)
Creatinine, Ser: 0.8 mg/dL (ref 0.50–1.10)
Glucose, Bld: 100 mg/dL — ABNORMAL HIGH (ref 70–99)
POTASSIUM: 3.7 mmol/L (ref 3.5–5.1)
SODIUM: 138 mmol/L (ref 135–145)

## 2014-05-31 LAB — TROPONIN I: Troponin I: <0.03 ng/mL (ref <0.031)

## 2014-05-31 NOTE — ED Notes (Signed)
Patient ok for d/c per Dr Claudine Mouton.

## 2014-05-31 NOTE — Discharge Instructions (Signed)
General Headache Without Cause A headache is pain or discomfort felt around the head or neck area. The specific cause of a headache may not be found. There are many causes and types of headaches. A few common ones are:  Tension headaches.  Migraine headaches.  Cluster headaches.  Chronic daily headaches. HOME CARE INSTRUCTIONS   Keep all follow-up appointments with your caregiver or any specialist referral.  Only take over-the-counter or prescription medicines for pain or discomfort as directed by your caregiver.  Lie down in a dark, quiet room when you have a headache.  Keep a headache journal to find out what may trigger your migraine headaches. For example, write down:  What you eat and drink.  How much sleep you get.  Any change to your diet or medicines.  Try massage or other relaxation techniques.  Put ice packs or heat on the head and neck. Use these 3 to 4 times per day for 15 to 20 minutes each time, or as needed.  Limit stress.  Sit up straight, and do not tense your muscles.  Quit smoking if you smoke.  Limit alcohol use.  Decrease the amount of caffeine you drink, or stop drinking caffeine.  Eat and sleep on a regular schedule.  Get 7 to 9 hours of sleep, or as recommended by your caregiver.  Keep lights dim if bright lights bother you and make your headaches worse. SEEK MEDICAL CARE IF:   You have problems with the medicines you were prescribed.  Your medicines are not working.  You have a change from the usual headache.  You have nausea or vomiting. SEEK IMMEDIATE MEDICAL CARE IF:   Your headache becomes severe.  You have a fever.  You have a stiff neck.  You have loss of vision.  You have muscular weakness or loss of muscle control.  You start losing your balance or have trouble walking.  You feel faint or pass out.  You have severe symptoms that are different from your first symptoms. MAKE SURE YOU:   Understand these  instructions.  Will watch your condition.  Will get help right away if you are not doing well or get worse. Document Released: 01/25/2005 Document Revised: 04/19/2011 Document Reviewed: 02/10/2011 [facility] Patient Information 2015 [place], [place]. This information is not intended to replace advice given to you by your health care provider. Make sure you discuss any questions you have with your health care provider.  Syncope Syncope is a medical term for fainting or passing out. This means you lose consciousness and drop to the ground. People are generally unconscious for less than 5 minutes. You may have some muscle twitches for up to 15 seconds before waking up and returning to normal. Syncope occurs more often in older adults, but it can happen to anyone. While most causes of syncope are not dangerous, syncope can be a sign of a serious medical problem. It is important to seek medical care.  CAUSES  Syncope is caused by a sudden drop in blood flow to the brain. The specific cause is often not determined. Factors that can bring on syncope include:  Taking medicines that lower blood pressure.  Sudden changes in posture, such as standing up quickly.  Taking more medicine than prescribed.  Standing in one place for too long.  Seizure disorders.  Dehydration and excessive exposure to heat.  Low blood sugar (hypoglycemia).  Straining to have a bowel movement.  Heart disease, irregular heartbeat, or other circulatory problems.  Fear,  emotional distress, seeing blood, or severe pain. SYMPTOMS  Right before fainting, you may:  Feel dizzy or light-headed.  Feel nauseous.  See all white or all black in your field of vision.  Have cold, clammy skin. DIAGNOSIS  Your health care provider will ask about your symptoms, perform a physical exam, and perform an electrocardiogram (ECG) to record the electrical activity of your heart. Your health care provider may also perform other heart or  blood tests to determine the cause of your syncope which may include:  Transthoracic echocardiogram (TTE). During echocardiography, sound waves are used to evaluate how blood flows through your heart.  Transesophageal echocardiogram (TEE).  Cardiac monitoring. This allows your health care provider to monitor your heart rate and rhythm in real time.  Holter monitor. This is a portable device that records your heartbeat and can help diagnose heart arrhythmias. It allows your health care provider to track your heart activity for several days, if needed.  Stress tests by exercise or by giving medicine that makes the heart beat faster. TREATMENT  In most cases, no treatment is needed. Depending on the cause of your syncope, your health care provider may recommend changing or stopping some of your medicines. HOME CARE INSTRUCTIONS  Have someone stay with you until you feel stable.  Do not drive, use machinery, or play sports until your health care provider says it is okay.  Keep all follow-up appointments as directed by your health care provider.  Lie down right away if you start feeling like you might faint. Breathe deeply and steadily. Wait until all the symptoms have passed.  Drink enough fluids to keep your urine clear or pale yellow.  If you are taking blood pressure or heart medicine, get up slowly and take several minutes to sit and then stand. This can reduce dizziness. SEEK IMMEDIATE MEDICAL CARE IF:   You have a severe headache.  You have unusual pain in the chest, abdomen, or back.  You are bleeding from your mouth or rectum, or you have black or tarry stool.  You have an irregular or very fast heartbeat.  You have pain with breathing.  You have repeated fainting or seizure-like jerking during an episode.  You faint when sitting or lying down.  You have confusion.  You have trouble walking.  You have severe weakness.  You have vision problems. If you fainted,  call your local emergency services (911 in [place] Do not drive yourself to the hospital.  MAKE SURE YOU:  Understand these instructions.  Will watch your condition.  Will get help right away if you are not doing well or get worse. Document Released: 01/25/2005 Document Revised: 01/30/2013 Document Reviewed: 03/26/2011 [facility] Patient Information 2015 [place], [place]. This information is not intended to replace advice given to you by your health care provider. Make sure you discuss any questions you have with your health care provider.

## 2014-06-12 ENCOUNTER — Encounter (HOSPITAL_COMMUNITY): Payer: Self-pay | Admitting: *Deleted

## 2014-06-12 DIAGNOSIS — Z7951 Long term (current) use of inhaled steroids: Secondary | ICD-10-CM | POA: Insufficient documentation

## 2014-06-12 DIAGNOSIS — E669 Obesity, unspecified: Secondary | ICD-10-CM | POA: Insufficient documentation

## 2014-06-12 DIAGNOSIS — Z91040 Latex allergy status: Secondary | ICD-10-CM | POA: Insufficient documentation

## 2014-06-12 DIAGNOSIS — N63 Unspecified lump in breast: Secondary | ICD-10-CM | POA: Insufficient documentation

## 2014-06-12 DIAGNOSIS — Z3202 Encounter for pregnancy test, result negative: Secondary | ICD-10-CM | POA: Insufficient documentation

## 2014-06-12 DIAGNOSIS — D252 Subserosal leiomyoma of uterus: Secondary | ICD-10-CM | POA: Insufficient documentation

## 2014-06-12 DIAGNOSIS — R197 Diarrhea, unspecified: Secondary | ICD-10-CM | POA: Insufficient documentation

## 2014-06-12 DIAGNOSIS — R110 Nausea: Secondary | ICD-10-CM | POA: Insufficient documentation

## 2014-06-12 DIAGNOSIS — Z791 Long term (current) use of non-steroidal anti-inflammatories (NSAID): Secondary | ICD-10-CM | POA: Insufficient documentation

## 2014-06-12 DIAGNOSIS — Z8639 Personal history of other endocrine, nutritional and metabolic disease: Secondary | ICD-10-CM | POA: Insufficient documentation

## 2014-06-12 DIAGNOSIS — Z792 Long term (current) use of antibiotics: Secondary | ICD-10-CM | POA: Insufficient documentation

## 2014-06-12 NOTE — ED Notes (Signed)
C/o lt flank pain for 5 days nausea and diarrhea.   No urinary symptoms.  lmp  2 weeks ago

## 2014-06-13 ENCOUNTER — Emergency Department (HOSPITAL_COMMUNITY): Payer: No Typology Code available for payment source

## 2014-06-13 ENCOUNTER — Encounter (HOSPITAL_COMMUNITY): Payer: Self-pay | Admitting: Radiology

## 2014-06-13 ENCOUNTER — Emergency Department (HOSPITAL_COMMUNITY)
Admission: EM | Admit: 2014-06-13 | Discharge: 2014-06-13 | Disposition: A | Discharge status: Home / Self Care | Payer: Self-pay | Attending: Emergency Medicine | Admitting: Emergency Medicine

## 2014-06-13 DIAGNOSIS — R197 Diarrhea, unspecified: Secondary | ICD-10-CM

## 2014-06-13 DIAGNOSIS — D252 Subserosal leiomyoma of uterus: Secondary | ICD-10-CM

## 2014-06-13 DIAGNOSIS — R109 Unspecified abdominal pain: Secondary | ICD-10-CM

## 2014-06-13 DIAGNOSIS — N63 Unspecified lump in unspecified breast: Secondary | ICD-10-CM

## 2014-06-13 LAB — CBC WITH DIFFERENTIAL/PLATELET
Basophils Absolute: 0.1 10*3/uL (ref 0.0–0.1)
Basophils Relative: 1 % (ref 0–1)
EOS ABS: 0.1 10*3/uL (ref 0.0–0.7)
EOS PCT: 1 % (ref 0–5)
HCT: 33.6 % — ABNORMAL LOW (ref 36.0–46.0)
Hemoglobin: 10.3 g/dL — ABNORMAL LOW (ref 12.0–15.0)
LYMPHS ABS: 3.1 10*3/uL (ref 0.7–4.0)
Lymphocytes Relative: 26 % (ref 12–46)
MCH: 24.2 pg — AB (ref 26.0–34.0)
MCHC: 30.7 g/dL (ref 30.0–36.0)
MCV: 79.1 fL (ref 78.0–100.0)
MONO ABS: 0.8 10*3/uL (ref 0.1–1.0)
Monocytes Relative: 7 % (ref 3–12)
Neutro Abs: 7.9 10*3/uL — ABNORMAL HIGH (ref 1.7–7.7)
Neutrophils Relative %: 65 % (ref 43–77)
PLATELETS: 358 10*3/uL (ref 150–400)
RBC: 4.25 MIL/uL (ref 3.87–5.11)
RDW: 16.2 % — ABNORMAL HIGH (ref 11.5–15.5)
WBC: 12 10*3/uL — ABNORMAL HIGH (ref 4.0–10.5)

## 2014-06-13 LAB — URINALYSIS, ROUTINE W REFLEX MICROSCOPIC
Bilirubin Urine: NEGATIVE
Glucose, UA: NEGATIVE mg/dL
Hgb urine dipstick: NEGATIVE
KETONES UR: NEGATIVE mg/dL
NITRITE: NEGATIVE
PH: 6 (ref 5.0–8.0)
Protein, ur: NEGATIVE mg/dL
Specific Gravity, Urine: 1.025 (ref 1.005–1.030)
Urobilinogen, UA: 0.2 mg/dL (ref 0.0–1.0)

## 2014-06-13 LAB — COMPREHENSIVE METABOLIC PANEL
ALT: 17 U/L (ref 14–54)
AST: 13 U/L — AB (ref 15–41)
Albumin: 3.6 g/dL (ref 3.5–5.0)
Alkaline Phosphatase: 81 U/L (ref 38–126)
Anion gap: 8 (ref 5–15)
BUN: 8 mg/dL (ref 6–20)
CALCIUM: 9 mg/dL (ref 8.9–10.3)
CO2: 25 mmol/L (ref 22–32)
CREATININE: 0.94 mg/dL (ref 0.44–1.00)
Chloride: 104 mmol/L (ref 101–111)
GFR calc Af Amer: >60 mL/min (ref >60)
Glucose, Bld: 160 mg/dL — ABNORMAL HIGH (ref 70–99)
Potassium: 3.5 mmol/L (ref 3.5–5.1)
Sodium: 137 mmol/L (ref 135–145)
Total Bilirubin: 0.2 mg/dL — ABNORMAL LOW (ref 0.3–1.2)
Total Protein: 6.9 g/dL (ref 6.5–8.1)

## 2014-06-13 LAB — URINE MICROSCOPIC-ADD ON

## 2014-06-13 LAB — LIPASE, BLOOD: LIPASE: 34 U/L (ref 22–51)

## 2014-06-13 LAB — POC URINE PREG, ED: Preg Test, Ur: NEGATIVE

## 2014-06-13 MED ORDER — MELOXICAM 7.5 MG PO TABS: 7.5000 mg | ORAL_TABLET | Freq: Every day | ORAL | Status: DC

## 2014-06-13 MED ORDER — KETOROLAC TROMETHAMINE 30 MG/ML IJ SOLN
30.0000 mg | Freq: Once | INTRAMUSCULAR | Status: AC
  Administered 2014-06-13: 30 mg via INTRAVENOUS
  Filled 2014-06-13: qty 1

## 2014-06-13 MED ORDER — METHOCARBAMOL 500 MG PO TABS: 500.0000 mg | ORAL_TABLET | Freq: Two times a day (BID) | ORAL | Status: DC

## 2014-06-13 MED ORDER — SODIUM CHLORIDE 0.9 % IV BOLUS (SEPSIS)
1000.0000 mL | Freq: Once | INTRAVENOUS | Status: AC
Start: 2014-06-13 — End: 2014-06-13
  Administered 2014-06-13: 1000 mL via INTRAVENOUS

## 2014-06-13 MED ORDER — METHOCARBAMOL 500 MG PO TABS
1000.0000 mg | ORAL_TABLET | Freq: Once | ORAL | Status: AC
  Administered 2014-06-13: 1000 mg via ORAL
  Filled 2014-06-13: qty 2

## 2014-06-13 MED ORDER — DICYCLOMINE HCL 10 MG/ML IM SOLN
20.0000 mg | Freq: Once | INTRAMUSCULAR | Status: AC
  Administered 2014-06-13: 20 mg via INTRAMUSCULAR
  Filled 2014-06-13: qty 2

## 2014-06-13 NOTE — ED Notes (Signed)
RN attempted IV start; 2nd RN to start IV

## 2014-06-13 NOTE — ED Provider Notes (Signed)
CSN: [id number]     Arrival date & time 06/12/14  2259 History  This chart was scribed for Bianca Hazan, MD by Rayfield Citizen, ED Scribe. This patient was seen in room A05C/A05C and the patient's care was started at 12:49 AM.    Chief Complaint  Patient presents with  . Flank Pain   Patient is a 29 y.o. female presenting with flank pain. The history is provided by the patient. No language interpreter was used.  Flank Pain This is a new problem. The current episode started more than 2 days ago. The problem occurs constantly. The problem has been gradually worsening. Pertinent negatives include no chest pain, no headaches and no shortness of breath. Nothing aggravates the symptoms. Relieved by: Transient relief provided by acetaminophen and ibuprofen. Treatments tried: Acetaminophen and ibruprofen. The treatment provided mild relief.  HPI Comments: Bianca Dawson is a 29 y.o. female who presents to the Emergency Department complaining of 5 days of constant, gradually worsening "stabbing" left flank pain, radiating around to her LLQ, with associated nausea. She also notes 3 days of diarrhea (4x today, no blood) and increased urinary frequency. Patient states she took Tylenol with transient relief, then ibuprofen with transient relief; no other treatments or medications tried PTA. She denies prior experience with similar symptoms. She denies dysuria, fevers, vaginal discharge, recent trauma, strain or injury.   Prior surgical history includes cholecystectomy. First day of LNMP 05/29/14.   Past Medical History  Diagnosis Date  . Obesity   . Hypothyroid    Past Surgical History  Procedure Laterality Date  . Cholecystectomy    . Tonsillectomy    . Breast lumpectomy     Family History  Problem Relation Age of Onset  . Diabetes Father   . Heart attack Father   . Hyperlipidemia Father   . Hypertension Father   . Sudden death Neg Hx    History  Substance Use Topics  . Smoking status: Never  Smoker   . Smokeless tobacco: Not on file  . Alcohol Use: No   OB History    No data available     Review of Systems  Constitutional: Negative for fever.  Respiratory: Negative for shortness of breath.   Cardiovascular: Negative for chest pain.  Gastrointestinal: Positive for nausea and diarrhea.  Genitourinary: Positive for flank pain. Negative for dysuria and vaginal discharge.  Neurological: Negative for headaches.  All other systems reviewed and are negative.  Allergies  Bee venom; Strawberry; Oxycontin; and Latex  Home Medications   Prior to Admission medications   Medication Sig Start Date End Date Taking? Authorizing Provider  fluticasone (FLONASE) 50 MCG/ACT nasal spray Place 2 sprays into both nostrils daily. Patient not taking: Reported on 05/30/2014 05/16/14   Carman Ching, PA-C  HYDROcodone-acetaminophen (NORCO/VICODIN) 5-325 MG per tablet Take 1-2 tablets by mouth every 6 (six) hours as needed for moderate pain or severe pain. Patient not taking: Reported on 05/30/2014 12/22/13   Cleatrice Burke, PA-C  naproxen (NAPROSYN) 500 MG tablet Take 1 tablet (500 mg total) by mouth 2 (two) times daily. Patient not taking: Reported on 05/30/2014 06/08/13   Tanna Furry, MD  ondansetron (ZOFRAN ODT) 4 MG disintegrating tablet Take 1 tablet (4 mg total) by mouth every 8 (eight) hours as needed for nausea. Patient not taking: Reported on 05/30/2014 06/08/13   Tanna Furry, MD  penicillin v potassium (VEETID) 500 MG tablet Take 1 tablet (500 mg total) by mouth 4 (four) times daily. Patient not taking:  Reported on 05/30/2014 12/22/13   Cleatrice Burke, PA-C   BP 150/93 mmHg  Pulse 98  Temp(Src) 98.4 F (36.9 C)  Resp 20  Ht 5\' 7"  (1.702 m)  Wt 297 lb (134.718 kg)  BMI 46.51 kg/m2  SpO2 99%  LMP 05/29/2014 Physical Exam  Constitutional: She is oriented to person, place, and time. She appears well-developed and well-nourished. No distress.  HENT:  Head: Normocephalic and atraumatic.   Mouth/Throat: Oropharynx is clear and moist. No oropharyngeal exudate.  Moist mucous membranes.   Eyes: EOM are normal. Pupils are equal, round, and reactive to light.  Neck: Normal range of motion. Neck supple. No JVD present.  Cardiovascular: Normal rate, regular rhythm and normal heart sounds.  Exam reveals no gallop and no friction rub.   No murmur heard. Pulmonary/Chest: Effort normal and breath sounds normal. No respiratory distress. She has no wheezes. She has no rales.  Abdominal: Soft. Bowel sounds are normal. She exhibits no mass. There is no tenderness. There is no rebound and no guarding.  Musculoskeletal: Normal range of motion. She exhibits no edema.  Moves all extremities normally. Left-sided lumbar paraspinal spasm.  Lymphadenopathy:    She has no cervical adenopathy.  Neurological: She is alert and oriented to person, place, and time. She displays normal reflexes.  Skin: Skin is warm and dry. No rash noted.  Psychiatric: She has a normal mood and affect. Her behavior is normal.  Nursing note and vitals reviewed.   ED Course  Procedures   DIAGNOSTIC STUDIES: Oxygen Saturation is 99% on RA, normal by my interpretation.    COORDINATION OF CARE: 12:52 AM Discussed treatment plan with pt at bedside and pt agreed to plan.   Labs Review Labs Reviewed  CBC WITH DIFFERENTIAL/PLATELET - Abnormal; Notable for the following:    WBC 12.0 (*)    Hemoglobin 10.3 (*)    HCT 33.6 (*)    MCH 24.2 (*)    RDW 16.2 (*)    All other components within normal limits  COMPREHENSIVE METABOLIC PANEL  LIPASE, BLOOD  URINALYSIS, ROUTINE W REFLEX MICROSCOPIC  POC URINE PREG, ED    Imaging Review No results found.   EKG Interpretation None      MDM   Final diagnoses:  None  Uterine fibroid and breast lesion.  No stones or colon abnormalities.  Will treat pain. Will refer to GYN for fibroid and the breast center for imaging and ongoing care.  Patient verbalizes understanding  and agrees to follow up  I personally performed the services described in this documentation, which was scribed in my presence. The recorded information has been reviewed and is accurate.      Veatrice Kells, MD 06/13/14 [phone]

## 2014-07-08 IMAGING — CR DG CHEST 2V
2 series · 2 of 2 positions shown · non-contrast
Comparison: Chest x-ray 12/21/2012.

CLINICAL DATA: Pain extending from the neck to the abdomen.

EXAM:
CHEST  2 VIEW

[w chest pa]
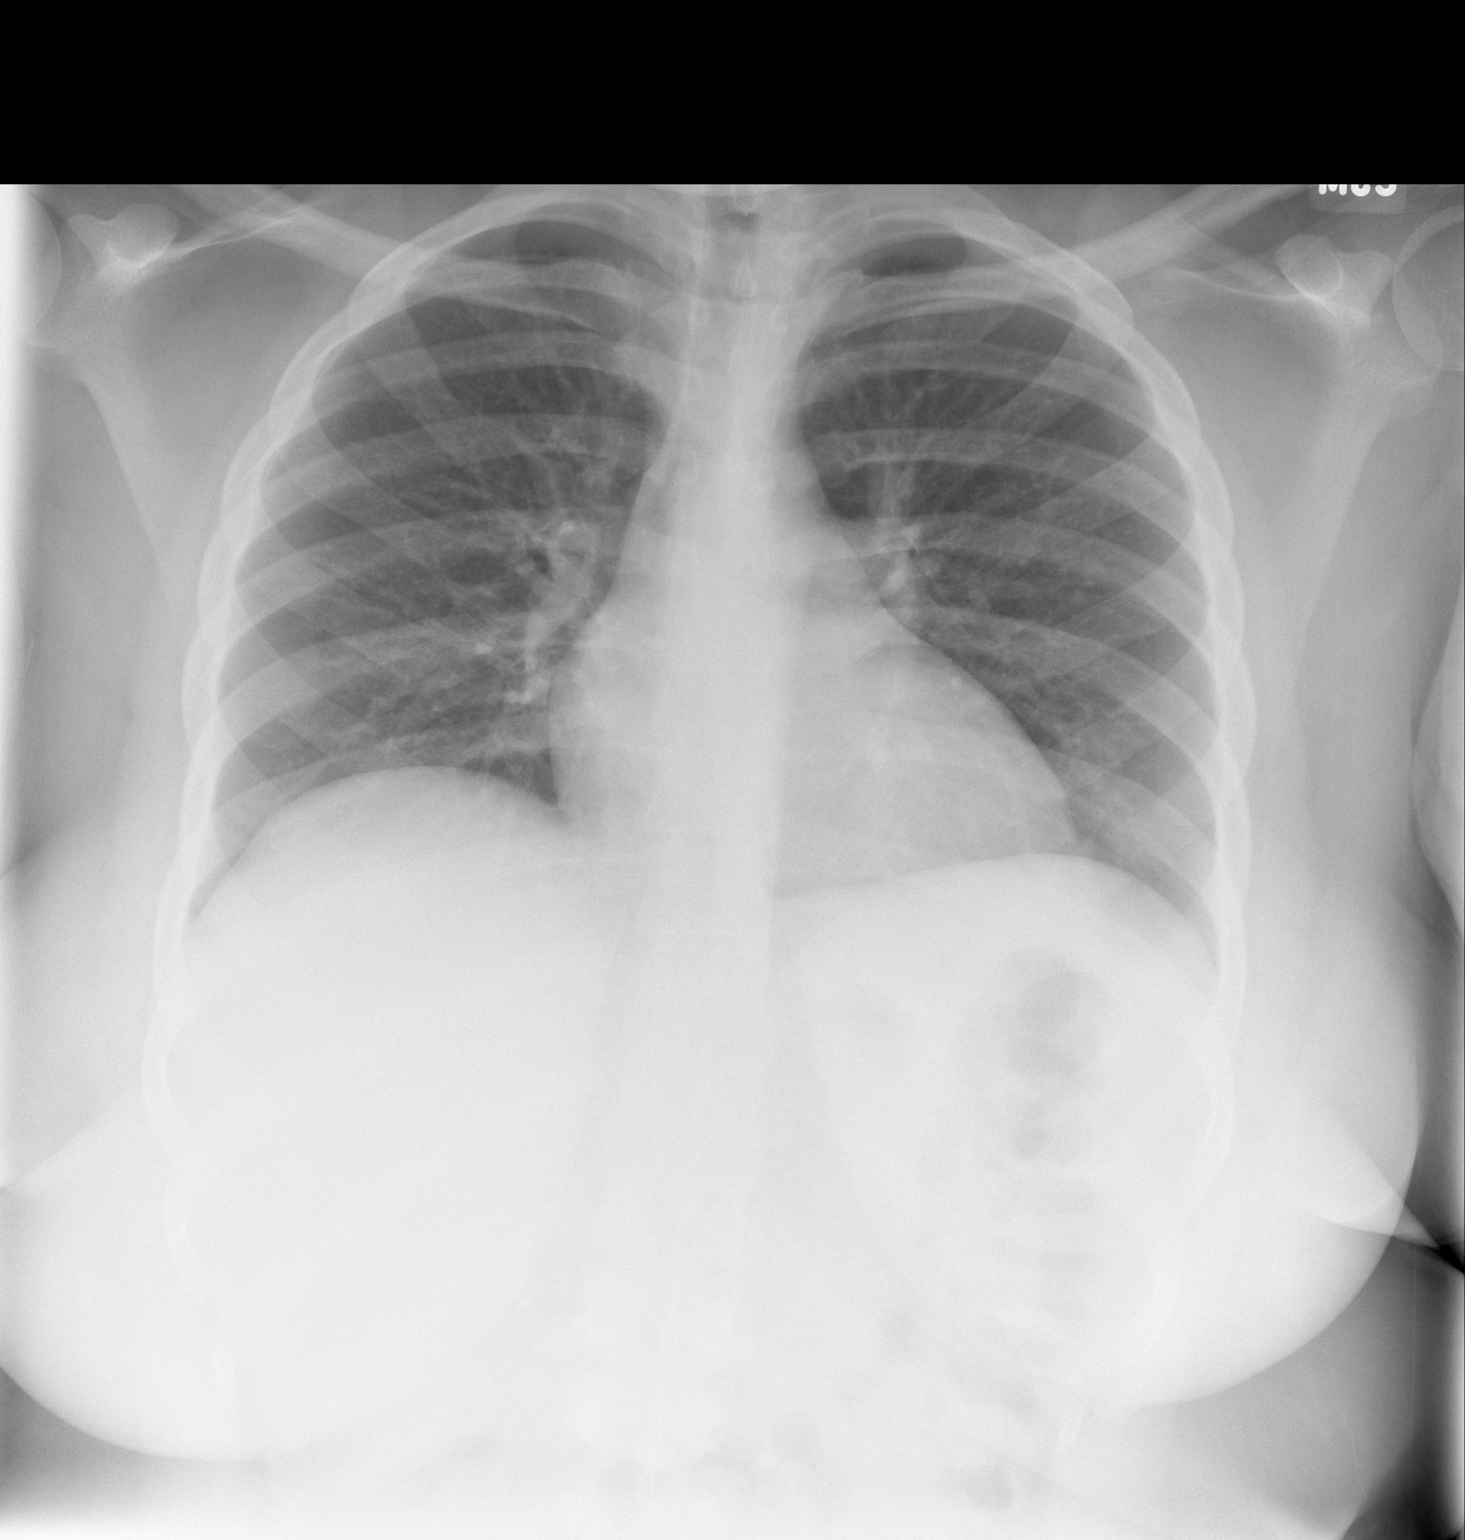

[w chest lat]
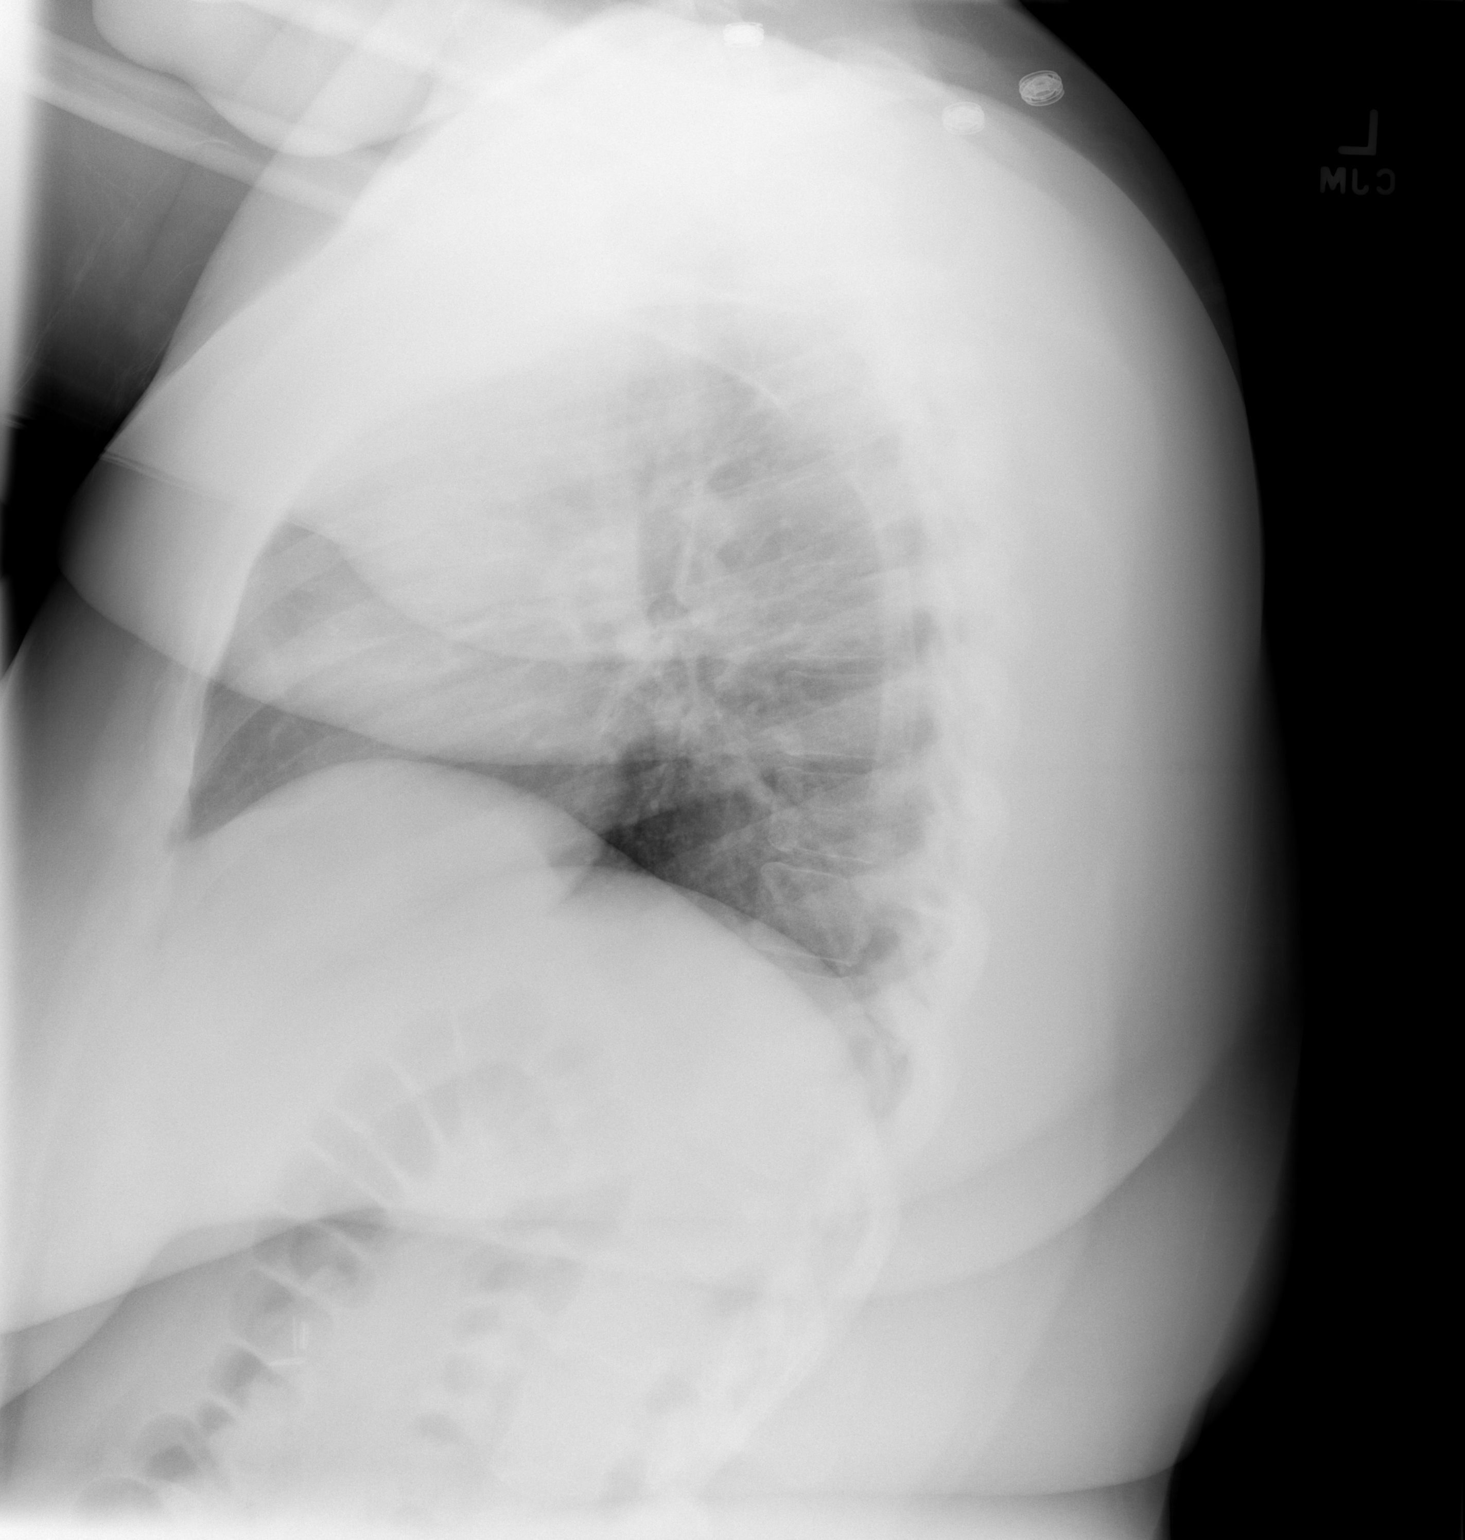

[2 of 2 positions shown; findings below may reference images not displayed]

FINDINGS: Lung volumes are normal. No consolidative airspace disease. No
pleural effusions. No pneumothorax. No pulmonary nodule or mass
noted. Pulmonary vasculature and the cardiomediastinal silhouette
are within normal limits.
IMPRESSION: 1.  No radiographic evidence of acute cardiopulmonary disease.

## 2014-08-08 ENCOUNTER — Emergency Department (HOSPITAL_COMMUNITY)
Admission: EM | Admit: 2014-08-08 | Discharge: 2014-08-08 | Disposition: A | Discharge status: Home / Self Care | Payer: No Typology Code available for payment source | Attending: Emergency Medicine | Admitting: Emergency Medicine

## 2014-08-08 ENCOUNTER — Encounter (HOSPITAL_COMMUNITY): Payer: Self-pay | Admitting: Emergency Medicine

## 2014-08-08 DIAGNOSIS — L989 Disorder of the skin and subcutaneous tissue, unspecified: Secondary | ICD-10-CM

## 2014-08-08 DIAGNOSIS — Z791 Long term (current) use of non-steroidal anti-inflammatories (NSAID): Secondary | ICD-10-CM | POA: Insufficient documentation

## 2014-08-08 DIAGNOSIS — Z792 Long term (current) use of antibiotics: Secondary | ICD-10-CM | POA: Insufficient documentation

## 2014-08-08 DIAGNOSIS — Z3202 Encounter for pregnancy test, result negative: Secondary | ICD-10-CM | POA: Insufficient documentation

## 2014-08-08 DIAGNOSIS — Z91040 Latex allergy status: Secondary | ICD-10-CM | POA: Insufficient documentation

## 2014-08-08 DIAGNOSIS — Z7951 Long term (current) use of inhaled steroids: Secondary | ICD-10-CM | POA: Insufficient documentation

## 2014-08-08 DIAGNOSIS — E669 Obesity, unspecified: Secondary | ICD-10-CM | POA: Insufficient documentation

## 2014-08-08 DIAGNOSIS — N9089 Other specified noninflammatory disorders of vulva and perineum: Secondary | ICD-10-CM | POA: Insufficient documentation

## 2014-08-08 LAB — URINALYSIS, ROUTINE W REFLEX MICROSCOPIC
Bilirubin Urine: NEGATIVE
GLUCOSE, UA: NEGATIVE mg/dL
Hgb urine dipstick: NEGATIVE
KETONES UR: NEGATIVE mg/dL
Leukocytes, UA: NEGATIVE
Nitrite: NEGATIVE
Protein, ur: NEGATIVE mg/dL
SPECIFIC GRAVITY, URINE: 1.017 (ref 1.005–1.030)
UROBILINOGEN UA: 1 mg/dL (ref 0.0–1.0)
pH: 7 (ref 5.0–8.0)

## 2014-08-08 LAB — POC URINE PREG, ED: PREG TEST UR: NEGATIVE

## 2014-08-08 MED ORDER — HYDROMORPHONE HCL 2 MG/ML IJ SOLN
2.0000 mg | Freq: Once | INTRAMUSCULAR | Status: AC
  Administered 2014-08-08: 2 mg via INTRAMUSCULAR
  Filled 2014-08-08: qty 1

## 2014-08-08 MED ORDER — SULFAMETHOXAZOLE-TRIMETHOPRIM 800-160 MG PO TABS: 1.0000 | ORAL_TABLET | Freq: Two times a day (BID) | ORAL | Status: AC

## 2014-08-08 MED ORDER — ONDANSETRON 8 MG PO TBDP
8.0000 mg | ORAL_TABLET | Freq: Once | ORAL | Status: AC
  Administered 2014-08-08: 8 mg via ORAL
  Filled 2014-08-08: qty 1

## 2014-08-08 MED ORDER — IBUPROFEN 800 MG PO TABS: 800.0000 mg | ORAL_TABLET | Freq: Three times a day (TID) | ORAL | Status: AC | PRN

## 2014-08-08 MED ORDER — LIDOCAINE HCL (PF) 1 % IJ SOLN
5.0000 mL | Freq: Once | INTRAMUSCULAR | Status: AC
  Administered 2014-08-08: 5 mL
  Filled 2014-08-08: qty 5

## 2014-08-08 NOTE — Progress Notes (Signed)
Pt noted in EPIC without insurance per registration completed review.  Spoke with pt to offer uninsured resources Pt informs cm she has [employer] but forgot to bring her card Cm encouraged her to bring in her card to ED or to call back to provide registration with the correct information. Pt states she has an OB GYN and was scheduled to see OB GYn today but developed a fever along with vaginal lump.  CM encouraged pt to re schedule her OB GYn appt because most likely she is going to be referred back to see the OB GYN for f/u services.  Provided information for health dept to use as needed

## 2014-08-08 NOTE — ED Provider Notes (Signed)
CSN: [id number]     Arrival date & time 08/08/14  1200 History   First MD Initiated Contact with Patient 08/08/14 1205     Chief Complaint  Patient presents with  . Abdominal Pain     (Consider location/radiation/quality/duration/timing/severity/associated sxs/prior Treatment) The history is provided by the patient.     Pt noticed a painful bump at the opening of her vagina this morning and then developed crampy lower abdominal pain and temperature of 100.  Associated nausea. Took tylenol with mild relief.  Denies vomiting, bowel or urinary symptoms, abnormal vaginal discharge or bleeding.  LMP 1.5 weeks ago.  Pt is not sexually active x 6 years.  She had appt with Lynnell Chad this morning for colposcopy but pt came here instead.    Past Medical History  Diagnosis Date  . Obesity   . Hypothyroid    Past Surgical History  Procedure Laterality Date  . Cholecystectomy    . Tonsillectomy    . Breast lumpectomy     Family History  Problem Relation Age of Onset  . Diabetes Father   . Heart attack Father   . Hyperlipidemia Father   . Hypertension Father   . Sudden death Neg Hx    History  Substance Use Topics  . Smoking status: Never Smoker   . Smokeless tobacco: Not on file  . Alcohol Use: No   OB History    No data available     Review of Systems  All other systems reviewed and are negative.     Allergies  Bee venom; Strawberry; Oxycontin; Orange concentrate; and Latex  Home Medications   Prior to Admission medications   Medication Sig Start Date End Date Taking? Authorizing Provider  acetaminophen (TYLENOL) 500 MG tablet Take 500 mg by mouth every 6 (six) hours as needed for mild pain, moderate pain, fever or headache.   Yes Historical Provider, MD  fluticasone (FLONASE) 50 MCG/ACT nasal spray Place 2 sprays into both nostrils daily. Patient not taking: Reported on 05/30/2014 05/16/14   Carman Ching, PA-C  HYDROcodone-acetaminophen (NORCO/VICODIN) 5-325 MG per  tablet Take 1-2 tablets by mouth every 6 (six) hours as needed for moderate pain or severe pain. Patient not taking: Reported on 05/30/2014 12/22/13   Cleatrice Burke, PA-C  meloxicam (MOBIC) 7.5 MG tablet Take 1 tablet (7.5 mg total) by mouth daily. Patient not taking: Reported on 08/08/2014 06/13/14   April Palumbo, MD  methocarbamol (ROBAXIN) 500 MG tablet Take 1 tablet (500 mg total) by mouth 2 (two) times daily. Patient not taking: Reported on 08/08/2014 06/13/14   April Palumbo, MD  naproxen (NAPROSYN) 500 MG tablet Take 1 tablet (500 mg total) by mouth 2 (two) times daily. Patient not taking: Reported on 05/30/2014 06/08/13   Tanna Furry, MD  ondansetron (ZOFRAN ODT) 4 MG disintegrating tablet Take 1 tablet (4 mg total) by mouth every 8 (eight) hours as needed for nausea. Patient not taking: Reported on 05/30/2014 06/08/13   Tanna Furry, MD  penicillin v potassium (VEETID) 500 MG tablet Take 1 tablet (500 mg total) by mouth 4 (four) times daily. Patient not taking: Reported on 05/30/2014 12/22/13   Cleatrice Burke, PA-C   BP 173/91 mmHg  Pulse 110  Temp(Src) 98.3 F (36.8 C) (Oral)  Resp 18  SpO2 100% Physical Exam  Constitutional: She appears well-developed and well-nourished. No distress.  HENT:  Head: Normocephalic and atraumatic.  Neck: Neck supple.  Cardiovascular: Normal rate and regular rhythm.   Pulmonary/Chest: Effort  normal and breath sounds normal. No respiratory distress. She has no wheezes. She has no rales.  Abdominal: Soft. She exhibits no distension. There is no tenderness. There is no rebound and no guarding.  obese  Genitourinary:     Neurological: She is alert.  Skin: She is not diaphoretic.  Nursing note and vitals reviewed.   ED Course  Procedures (including critical care time) Labs Review Labs Reviewed  URINALYSIS, ROUTINE W REFLEX MICROSCOPIC (NOT AT [facility])  POC URINE PREG, ED    Imaging Review No results found.   EKG Interpretation None       INCISION  AND DRAINAGE Performed by: Clayton Bibles Consent: Verbal consent obtained. Risks and benefits: risks, benefits and alternatives were discussed Type: abscess  Body area: vulva, posterior  Anesthesia: local infiltration  Incision was made with a scalpel.  Local anesthetic: lidocaine 1% no epinephrine  Anesthetic total: 1 ml  Complexity: complex Blunt dissection to break up loculations  Drainage: none  Drainage amount: none  Packing material: none  Patient tolerance: Patient tolerated the procedure well with no immediate complications.    3:16 PM Reexamination of the abdomen remains benign.  Soft, nontender, no guarding, no rebound.    MDM   Final diagnoses:  Skin lesion    Afebrile, nontoxic patient with single tender nodule of the vulva.  No cellulitis.  Appears to be early abscess but not located in area of bartholin's gland.  Pt denies vaginal/internal symptoms or discharge, none noted on external exam.  Pt with no risk factors for STDs.  I&D without drainage but this was the only area that was tender, now has opening for drainage - pt advised to do warm soaks.    D/C home with bactrim, ibuprofen, gyn follow up. Discussed result, findings, treatment, and follow up  with patient.  Pt given return precautions.  Pt verbalizes understanding and agrees with plan.         Clayton Bibles, PA-C 08/08/14 [place], MD 08/09/14 [phone]

## 2014-08-08 NOTE — ED Notes (Signed)
Pt c/o boil outside of vagina, low medial abdominal pain, and nausea onset this morning. Pt denies dysuria, emesis, diarrhea, or drainage from boil.

## 2014-08-08 NOTE — Discharge Instructions (Signed)
Read the information below.  Use the prescribed medication as directed.  Please discuss all new medications with your pharmacist.  You may return to the Emergency Department at any time for worsening condition or any new symptoms that concern you.     If you develop redness, swelling, pus draining from the wound, or fevers greater than 100.4, return to the ER immediately for a recheck.   °

## 2014-12-13 ENCOUNTER — Encounter (HOSPITAL_COMMUNITY): Payer: Self-pay | Admitting: Emergency Medicine

## 2014-12-13 ENCOUNTER — Emergency Department (HOSPITAL_COMMUNITY)
Admission: EM | Admit: 2014-12-13 | Discharge: 2014-12-13 | Disposition: A | Discharge status: Home / Self Care | Payer: BLUE CROSS/BLUE SHIELD | Attending: Emergency Medicine | Admitting: Emergency Medicine

## 2014-12-13 ENCOUNTER — Emergency Department (HOSPITAL_COMMUNITY): Payer: BLUE CROSS/BLUE SHIELD

## 2014-12-13 DIAGNOSIS — Z91040 Latex allergy status: Secondary | ICD-10-CM | POA: Insufficient documentation

## 2014-12-13 DIAGNOSIS — E669 Obesity, unspecified: Secondary | ICD-10-CM | POA: Insufficient documentation

## 2014-12-13 DIAGNOSIS — M791 Myalgia: Secondary | ICD-10-CM | POA: Diagnosis not present

## 2014-12-13 DIAGNOSIS — J3489 Other specified disorders of nose and nasal sinuses: Secondary | ICD-10-CM | POA: Insufficient documentation

## 2014-12-13 DIAGNOSIS — R059 Cough, unspecified: Secondary | ICD-10-CM

## 2014-12-13 DIAGNOSIS — R000 Tachycardia, unspecified: Secondary | ICD-10-CM | POA: Diagnosis not present

## 2014-12-13 DIAGNOSIS — R05 Cough: Secondary | ICD-10-CM | POA: Diagnosis present

## 2014-12-13 MED ORDER — IPRATROPIUM BROMIDE 0.02 % IN SOLN
0.5000 mg | Freq: Once | RESPIRATORY_TRACT | Status: AC
  Administered 2014-12-13: 0.5 mg via RESPIRATORY_TRACT
  Filled 2014-12-13: qty 2.5

## 2014-12-13 MED ORDER — ACETAMINOPHEN 500 MG PO TABS: 500.0000 mg | ORAL_TABLET | Freq: Four times a day (QID) | ORAL | Status: AC | PRN

## 2014-12-13 MED ORDER — ALBUTEROL SULFATE (2.5 MG/3ML) 0.083% IN NEBU: 2.5000 mg | INHALATION_SOLUTION | Freq: Once | RESPIRATORY_TRACT | Status: DC

## 2014-12-13 MED ORDER — ALBUTEROL SULFATE (2.5 MG/3ML) 0.083% IN NEBU
5.0000 mg | INHALATION_SOLUTION | Freq: Once | RESPIRATORY_TRACT | Status: AC
  Administered 2014-12-13: 5 mg via RESPIRATORY_TRACT
  Filled 2014-12-13: qty 6

## 2014-12-13 MED ORDER — BENZONATATE 100 MG PO CAPS: 100.0000 mg | ORAL_CAPSULE | Freq: Three times a day (TID) | ORAL | Status: AC | PRN

## 2014-12-13 NOTE — Discharge Instructions (Signed)

## 2014-12-13 NOTE — ED Provider Notes (Signed)
CSN: [id number]     Arrival date & time 12/13/14  1013 History   First MD Initiated Contact with Patient 12/13/14 1057     Chief Complaint  Patient presents with  . Cough    3 day hx of cough  . Generalized Body Aches     (Consider location/radiation/quality/duration/timing/severity/associated sxs/prior Treatment) HPI   Bianca Dawson is a 29 y.o. female with PMH significant for obesity, hypothyroid who presents with gradually worsening persistent productive cough (yellowish, green), myalgias, and chest tightness for the past 3 days.  Nothing makes it better or worse.  Tried tea and tylenol, which has helped some.  Denies N/V, abdominal pain, headache, neck stiffness, back pain, or fevers.  Positive sick contacts, dad has PNA.   Past Medical History  Diagnosis Date  . Obesity   . Hypothyroid    Past Surgical History  Procedure Laterality Date  . Cholecystectomy    . Tonsillectomy    . Breast lumpectomy     Family History  Problem Relation Age of Onset  . Diabetes Father   . Heart attack Father   . Hyperlipidemia Father   . Hypertension Father   . Stroke Father   . Sudden death Neg Hx    Social History  Substance Use Topics  . Smoking status: Never Smoker   . Smokeless tobacco: None  . Alcohol Use: No   OB History    No data available     Review of Systems All other systems negative unless otherwise stated in HPI    Allergies  Bee venom; Strawberry extract; Oxycontin; Orange concentrate; and Latex  Home Medications   Prior to Admission medications   Medication Sig Start Date End Date Taking? Authorizing Provider  acetaminophen (TYLENOL) 500 MG tablet Take 1 tablet (500 mg total) by mouth every 6 (six) hours as needed. 12/13/14   Gloriann Loan, PA-C  benzonatate (TESSALON) 100 MG capsule Take 1 capsule (100 mg total) by mouth 3 (three) times daily as needed for cough. 12/13/14   Jerret Mcbane, PA-C  fluticasone (FLONASE) 50 MCG/ACT nasal spray Place 2 sprays into  both nostrils daily. Patient not taking: Reported on 05/30/2014 05/16/14   Carman Ching, PA-C  ibuprofen (ADVIL,MOTRIN) 800 MG tablet Take 1 tablet (800 mg total) by mouth every 8 (eight) hours as needed for mild pain or moderate pain. Patient not taking: Reported on 12/13/2014 08/08/14   Clayton Bibles, PA-C  sulfamethoxazole-trimethoprim (BACTRIM DS,SEPTRA DS) 800-160 MG per tablet Take 1 tablet by mouth 2 (two) times daily. X 5 days Patient not taking: Reported on 12/13/2014 08/08/14   Clayton Bibles, PA-C   BP 130/94 mmHg  Pulse 91  Temp(Src) 98.4 F (36.9 C) (Oral)  Wt 280 lb (127.007 kg)  SpO2 100%  LMP 11/12/2014 (Approximate) Physical Exam  Constitutional: She is oriented to person, place, and time. She appears well-developed and well-nourished.  HENT:  Head: Normocephalic and atraumatic.  Right Ear: Tympanic membrane normal.  Left Ear: Tympanic membrane normal.  Nose: Rhinorrhea present.  Mouth/Throat: Uvula is midline, oropharynx is clear and moist and mucous membranes are normal. No trismus in the jaw. No oropharyngeal exudate, posterior oropharyngeal edema or posterior oropharyngeal erythema.  Eyes: Conjunctivae are normal. Pupils are equal, round, and reactive to light.  Neck: Normal range of motion. Neck supple. No rigidity. Normal range of motion present.  Cardiovascular: Regular rhythm and normal heart sounds.  Tachycardia present.   No murmur heard. Pulmonary/Chest: Effort normal. No accessory muscle usage or  stridor. No respiratory distress. She has decreased breath sounds. She has no wheezes. She has no rhonchi. She has no rales.  Abdominal: Soft. Bowel sounds are normal. She exhibits no distension. There is no tenderness. There is no rigidity and no guarding.  Musculoskeletal: Normal range of motion.  Lymphadenopathy:    She has no cervical adenopathy.  Neurological: She is alert and oriented to person, place, and time.  Speech clear without dysarthria.  Skin: Skin is warm and  dry.  Psychiatric: She has a normal mood and affect. Her behavior is normal.    ED Course  Procedures (including critical care time) Labs Review Labs Reviewed - No data to display  Imaging Review Dg Chest 2 View  12/13/2014  CLINICAL DATA:  Cough for 3 days. EXAM: CHEST - 2 VIEW COMPARISON:  05/16/2014 FINDINGS: The heart size and mediastinal contours are within normal limits. There is no evidence of pulmonary edema, consolidation, pneumothorax, nodule or pleural fluid. The visualized skeletal structures are unremarkable. IMPRESSION: No active disease. Electronically Signed   By: Aletta Edouard M.D.   On: 12/13/2014 12:07   I have personally reviewed and evaluated these images and lab results as part of my medical decision-making.   EKG Interpretation None      MDM   Final diagnoses:  Cough   Centor criteria 0, no indication for rapid strep.  Will give breathing treatment here and obtain CXR. CXR negative.  Upon reassessment, breath sounds more audible.  Low likelihood PE.  D/c home with tessalon pearls and tylenol.  Discussed return precautions.  Patient agrees and acknowledges the above plan for discharge.    Gloriann Loan, PA-C 12/13/14 [place], MD 12/14/14 [phone]

## 2014-12-13 NOTE — ED Notes (Signed)
Pt reports 3 day hx of cough and generalized body aches. Denies fever, denies NVD

## 2015-01-15 ENCOUNTER — Ambulatory Visit: Payer: Self-pay | Admitting: Surgery

## 2015-01-15 NOTE — H&P (Signed)
History of Present Illness Bianca Dawson. Deepak Bless MD; 01/15/2015 1:11 PM) The patient is a 29 year old female who presents with a breast mass. Referred by Dr. Princess Bruins and Dr. Elige Radon for evaluation of left breast masses  This is a 29 year old female who had a previous right breast lumpectomy for fibroadenoma and 2015. This was performed in [facility]. About 3 months ago she began palpating a firm mass in the upper left breast. Subsequently she discovered another mass that is just medial to this area. She underwent a workup which included a left diagnostic mammogram and ultrasound on 12/23/14. 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 Subsequent ultrasound showed a 2.6 cm mass at 12:00 and an additional 4.5 cm fibroadenoma at 12:00 posterior depth. At 4:00 in the left breast there is a 1.1 cm fibroadenoma and in the upper medial breast at 11:00 there is a 3.8 cm fibroadenoma. The deepest mass at 12:00 was biopsied with clip placement on 12/26/14. This was felt to be a fibroadenoma. She presents now for excision of the large palpable masses. She cannot palpate a small mass at 4:00 and does not feel the need to have this removed.   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 Gallbladder Surgery - Laparoscopic Tonsillectomy  Diagnostic Studies History Elbert Ewings, [place]; 01/14/2015 2:48 PM) Colonoscopy never Mammogram within last year Pap Smear 1-5 years ago  Allergies Elbert Ewings, CMA; 01/14/2015 2:49 PM) Alesia Morin *ANALGESICS - OPIOID*  Medication History Elbert Ewings, CMA; 01/14/2015 2:49 PM) MetFORMIN HCl ER (500MG  Tablet ER 24HR, Oral daily) Active. Medications Reconciled  Social History Elbert Ewings, [place];  01/14/2015 2:48 PM) Caffeine use Tea. No alcohol use No drug use Tobacco use Never smoker.  Family History Elbert Ewings, [place]; 01/14/2015 2:48 PM) Arthritis Mother. Bleeding disorder Mother. Breast Cancer Family Members In General. Cerebrovascular Accident Father. Diabetes Mellitus Father. Heart Disease Father. Heart disease in female family member before age 5 Hypertension Father. Seizure disorder Father.  Pregnancy / Birth History Elbert Ewings, CMA; 01/14/2015 2:48 PM) Age at menarche 69 years. Gravida 0 Regular periods     Review of Systems Elbert Ewings CMA; 01/14/2015 2:48 PM) General Present- Fatigue. Not Present- Appetite Loss, Chills, Fever, Night Sweats, Weight Gain and Weight Loss. Skin Not Present- Change in Wart/Mole, Dryness, Hives, Jaundice, New Lesions, Non-Healing Wounds, Rash and Ulcer. HEENT Not Present- Earache, Hearing Loss, Hoarseness, Nose Bleed, Oral Ulcers, Ringing in the Ears, Seasonal Allergies, Sinus Pain, Sore Throat, Visual Disturbances, Wears glasses/contact lenses and Yellow Eyes. Respiratory Not Present- Bloody sputum, Chronic Cough, Difficulty Breathing, Snoring and Wheezing. Breast Present- Breast Mass, Breast Pain and Nipple Discharge. Not Present- Skin Changes. Cardiovascular Not Present- Chest Pain, Difficulty Breathing Lying Down, Leg Cramps, Palpitations, Rapid Heart Rate, Shortness of Breath and Swelling of Extremities. Gastrointestinal Not Present- Abdominal Pain, Bloating, Bloody Stool, Change in Bowel Habits, Chronic diarrhea, Constipation, Difficulty Swallowing, Excessive gas, Gets full quickly at meals, Hemorrhoids, Indigestion, Nausea, Rectal Pain and Vomiting. Female Genitourinary Not Present- Frequency, Nocturia, Painful Urination, Pelvic Pain and Urgency. Musculoskeletal Not Present- Back Pain, Joint Pain, Joint Stiffness, Muscle Pain, Muscle Weakness and Swelling of Extremities. Neurological Not Present- Decreased Memory,  Fainting, Headaches, Numbness, Seizures, Tingling, Tremor, Trouble walking and Weakness. Psychiatric Not Present- Anxiety,  Bipolar, Change in Sleep Pattern, Depression, Fearful and Frequent crying. Endocrine Not Present- Cold Intolerance, Excessive Hunger, Hair Changes, Heat Intolerance, Hot flashes and New Diabetes. Hematology Not Present- Easy Bruising, Excessive bleeding, Gland problems, HIV and Persistent Infections.  Vitals Elbert Ewings CMA; 01/14/2015 2:50 PM) 01/14/2015 2:49 PM Weight: 303 lb Height: 67in Body Surface Area: 2.41 m Body Mass Index: 47.46 kg/m  Temp.: 98.82F(Temporal)  Pulse: 84 (Regular)  BP: 136/76 (Sitting, Left Arm, Standard)      Physical Exam Rodman Key K. Haylee Mcanany MD; 01/15/2015 1:12 PM)  The physical exam findings are as follows: Note:WDWN in NAD HEENT: EOMI, sclera anicteric Neck: No masses, no thyromegaly Lungs: CTA bilaterally; normal respiratory effort Breasts: Right breast shows a well-healed lumpectomy scar with no palpable masses. No nipple retraction or discharge. No axillary lymphadenopathy. Left breast has 3 obvious palpable masses in the upper central breast. These are not well demarcated. There is no palpable mass in the lower outer quadrant. No axillary lymphadenopathy. No nipple retraction or discharge. CV: Regular rate and rhythm; no murmurs Abd: +bowel sounds, soft, non-tender, no masses Ext: Well-perfused; no edema Skin: Warm, dry; no sign of jaundice    Assessment & Plan Rodman Key K. 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 Potential risks, benefits, alternative treatments, and expected outcomes have been explained. All of the patient's questions at this time have been answered. The likelihood of reaching the  patient's treatment goal is good. The patient understand the proposed surgical procedure and wishes to proceed  Bianca Dawson. Georgette Dover, MD, [facility]  General/ Trauma Surgery  01/15/2015 1:13 PM

## 2015-11-30 IMAGING — CT CT HEAD W/O CM
2 series · 17 of 30 positions shown, 20 images · non-contrast
Comparison: None.

CLINICAL DATA: Headache, syncope.  Symptoms began this afternoon.

EXAM:
CT HEAD WITHOUT CONTRAST
TECHNIQUE: Contiguous axial images were obtained from the base of the skull
through the vertex without intravenous contrast.

[Series 2: head w/o · axial · non-contrast · 0.40mm/px · z∈[-91,+24]mm · 9 of 29 slices shown, 12 images]
[im 3/29  brain]
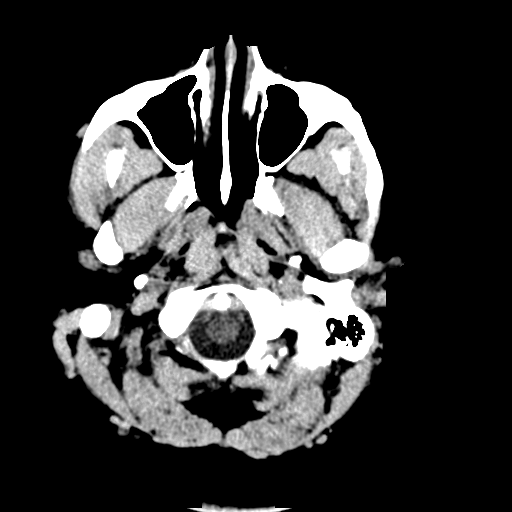
[im 3/29  bone]
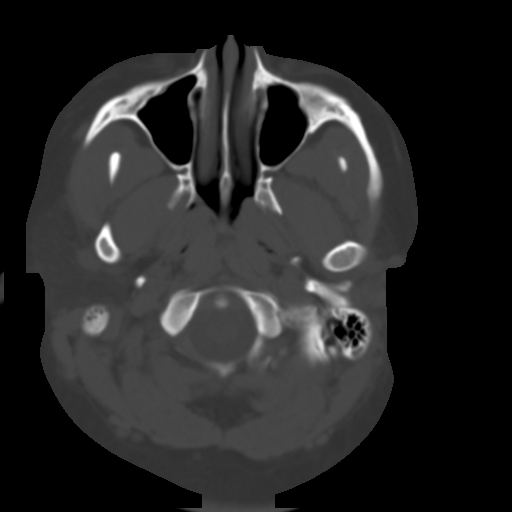
[im 6/29  brain]
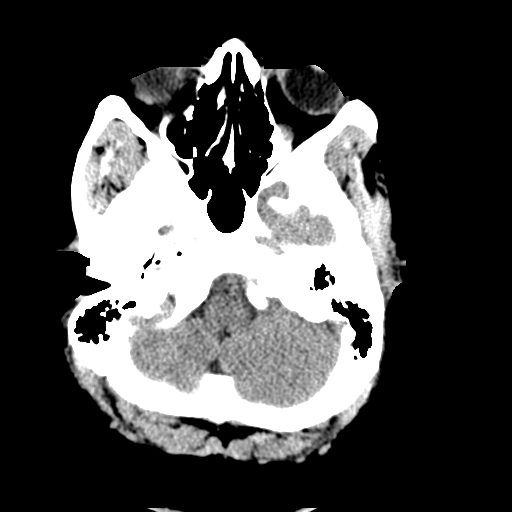
[im 9/29  brain]
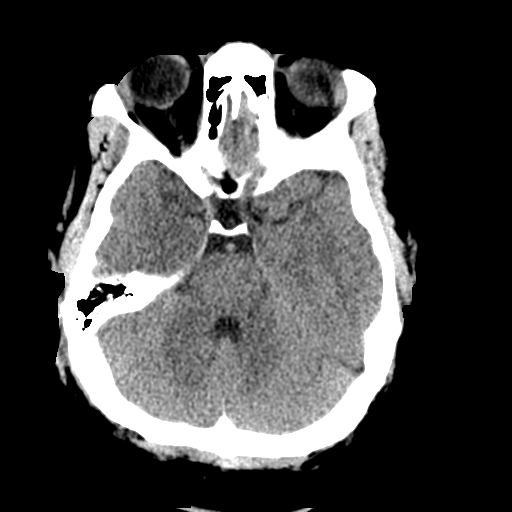
[im 12/29  brain]
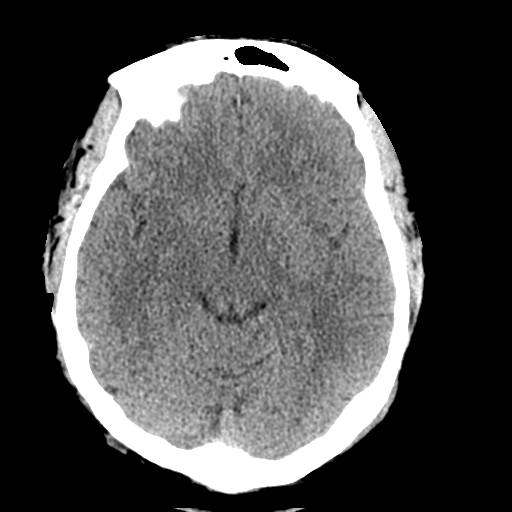
[im 15/29  brain]
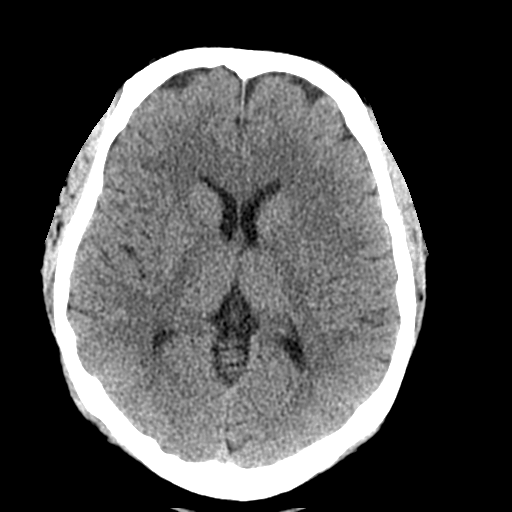
[im 15/29  bone]
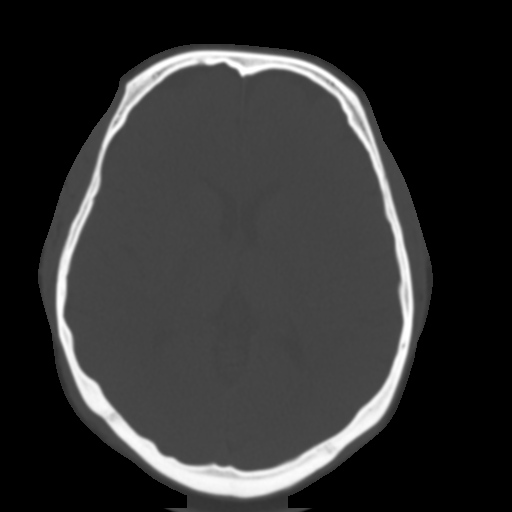
[im 17/29  brain]
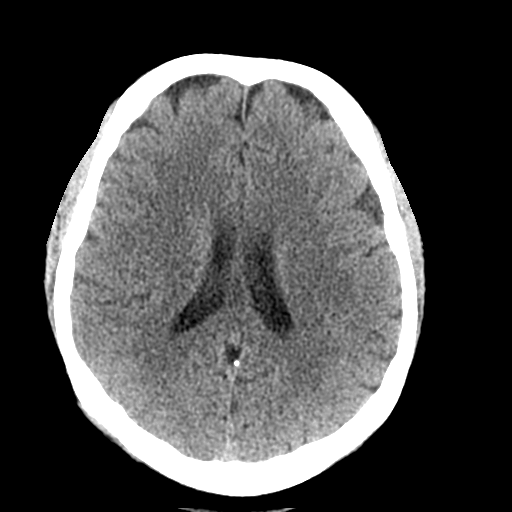
[im 20/29  brain]
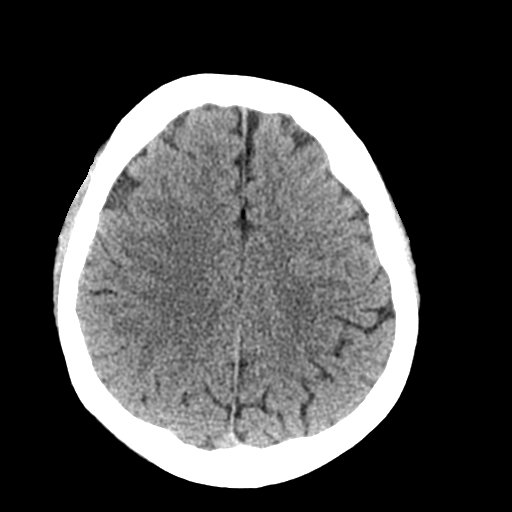
[im 23/29  brain]
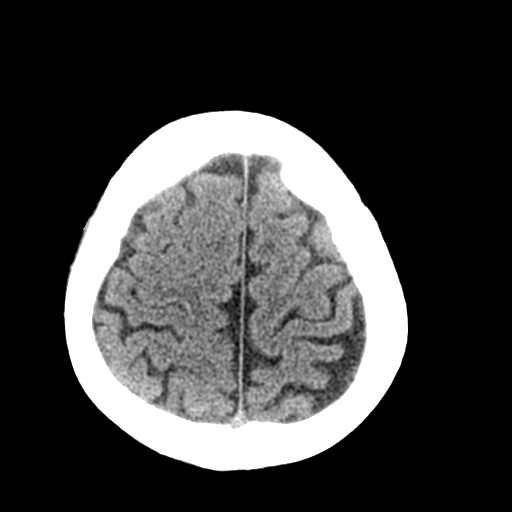
[im 26/29  brain]
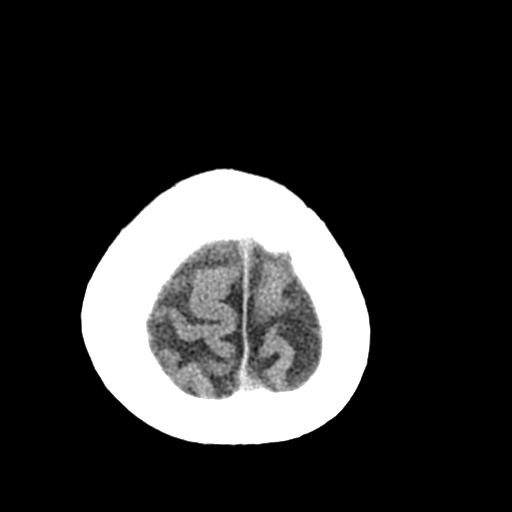
[im 26/29  bone]
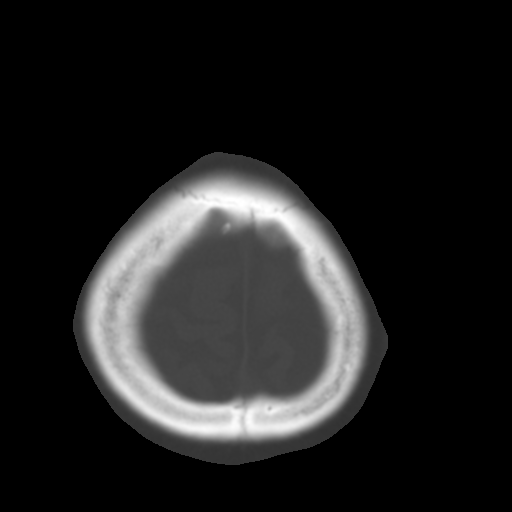

[Series 3: bone windows · axial · 0.40mm/px · z∈[-86,+25]mm · 8 of 49 slices shown]
[im 6/49  bone]
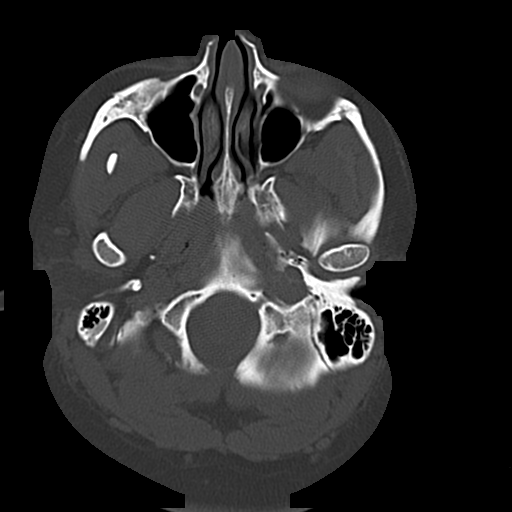
[im 11/49  bone]
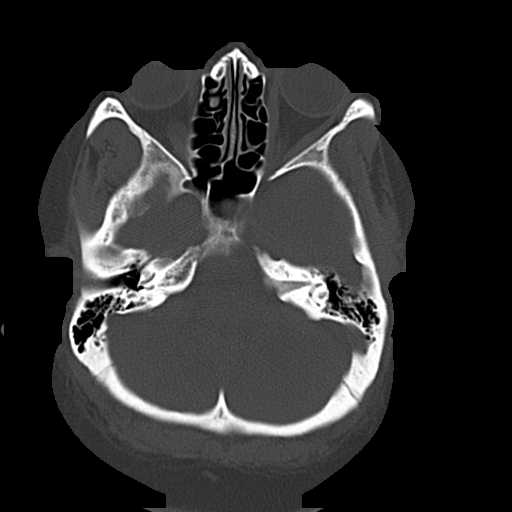
[im 17/49  bone]
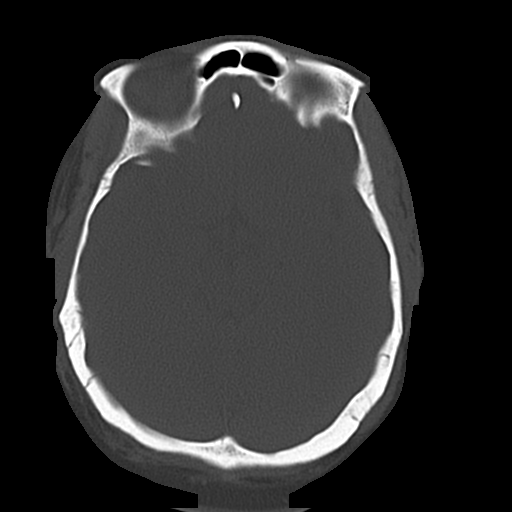
[im 22/49  bone]
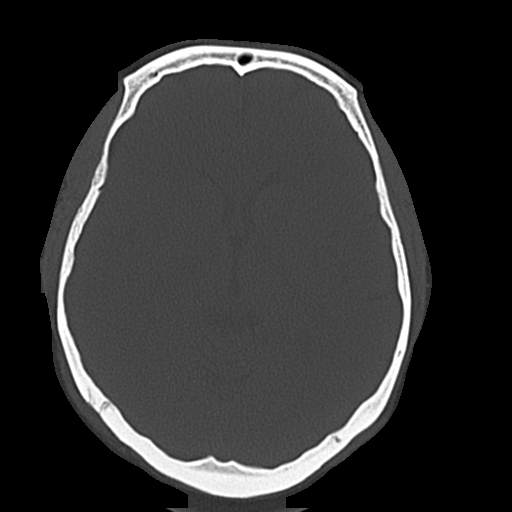
[im 27/49  bone]
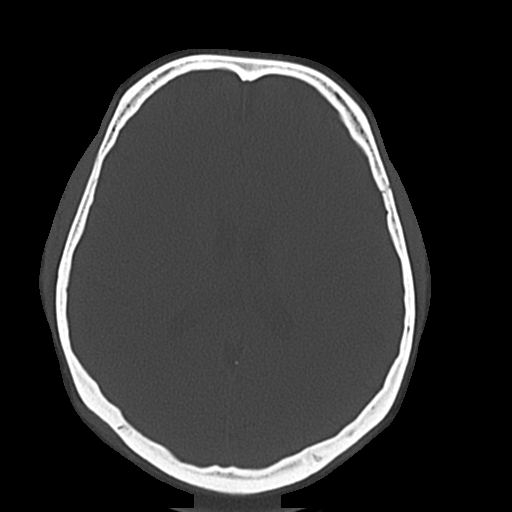
[im 33/49  bone]
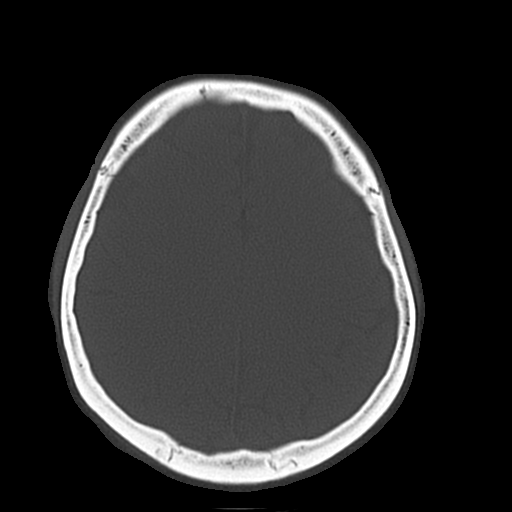
[im 38/49  bone]
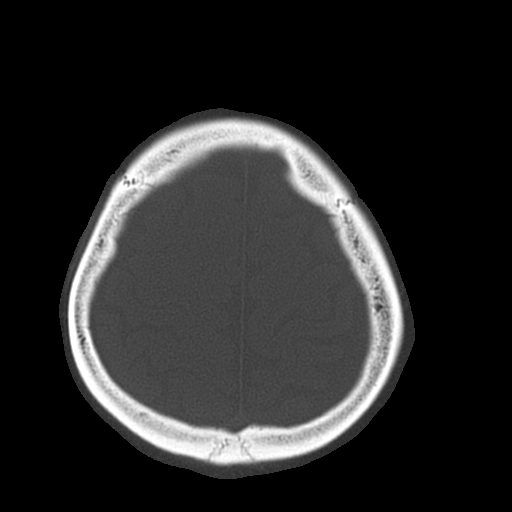
[im 43/49  bone]
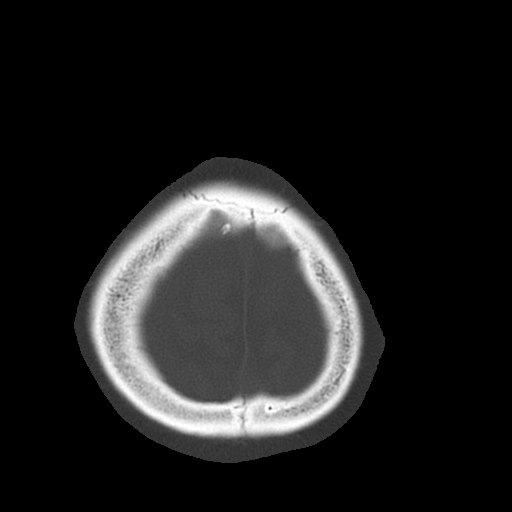

[17 of 30 positions shown; findings below may reference images not displayed]

FINDINGS: No acute intracranial abnormality. Specifically, no hemorrhage,
hydrocephalus, mass lesion, acute infarction, or significant
intracranial injury. No acute calvarial abnormality. Visualized
paranasal sinuses and mastoids clear. Orbital soft tissues
unremarkable.
IMPRESSION: Negative

## 2023-10-06 ENCOUNTER — Other Ambulatory Visit: Payer: Self-pay | Admitting: Medical Genetics

## 2023-11-25 ENCOUNTER — Other Ambulatory Visit: Payer: Self-pay | Admitting: Medical Genetics

## 2023-11-25 DIAGNOSIS — Z006 Encounter for examination for normal comparison and control in clinical research program: Secondary | ICD-10-CM
